# Patient Record
Sex: Female | Born: 1950 | Race: Black or African American | Hispanic: No | Marital: Married | Smoking: Former smoker
Health system: Southern US, Community
[De-identification: ages and names within clinical notes are randomized; demographics above are authoritative.]

## PROBLEM LIST (undated history)

## (undated) DIAGNOSIS — N200 Calculus of kidney: Secondary | ICD-10-CM

## (undated) DIAGNOSIS — IMO0002 Reserved for concepts with insufficient information to code with codable children: Secondary | ICD-10-CM

## (undated) DIAGNOSIS — T7840XA Allergy, unspecified, initial encounter: Secondary | ICD-10-CM

## (undated) DIAGNOSIS — G510 Bell's palsy: Secondary | ICD-10-CM

## (undated) DIAGNOSIS — I10 Essential (primary) hypertension: Secondary | ICD-10-CM

## (undated) DIAGNOSIS — D649 Anemia, unspecified: Secondary | ICD-10-CM

## (undated) DIAGNOSIS — E669 Obesity, unspecified: Secondary | ICD-10-CM

## (undated) DIAGNOSIS — M329 Systemic lupus erythematosus, unspecified: Secondary | ICD-10-CM

## (undated) DIAGNOSIS — K219 Gastro-esophageal reflux disease without esophagitis: Secondary | ICD-10-CM

## (undated) DIAGNOSIS — M797 Fibromyalgia: Secondary | ICD-10-CM

## (undated) DIAGNOSIS — K635 Polyp of colon: Secondary | ICD-10-CM

## (undated) DIAGNOSIS — E079 Disorder of thyroid, unspecified: Secondary | ICD-10-CM

## (undated) HISTORY — PX: BREAST EXCISIONAL BIOPSY: SUR124

## (undated) HISTORY — DX: Disorder of thyroid, unspecified: E07.9

## (undated) HISTORY — DX: Obesity, unspecified: E66.9

## (undated) HISTORY — DX: Gastro-esophageal reflux disease without esophagitis: K21.9

## (undated) HISTORY — PX: BREAST LUMPECTOMY: SHX2

## (undated) HISTORY — PX: BREAST BIOPSY: SHX20

## (undated) HISTORY — DX: Essential (primary) hypertension: I10

## (undated) HISTORY — PX: AUGMENTATION MAMMAPLASTY: SUR837

## (undated) HISTORY — DX: Calculus of kidney: N20.0

## (undated) HISTORY — DX: Anemia, unspecified: D64.9

## (undated) HISTORY — DX: Systemic lupus erythematosus, unspecified: M32.9

## (undated) HISTORY — DX: Polyp of colon: K63.5

## (undated) HISTORY — DX: Reserved for concepts with insufficient information to code with codable children: IMO0002

## (undated) HISTORY — PX: POLYPECTOMY: SHX149

## (undated) HISTORY — DX: Allergy, unspecified, initial encounter: T78.40XA

---

## 1997-12-25 ENCOUNTER — Other Ambulatory Visit: Admission: RE | Admit: 1997-12-25 | Discharge: 1997-12-25 | Payer: Self-pay | Admitting: Nephrology

## 1998-03-03 ENCOUNTER — Other Ambulatory Visit: Admission: RE | Admit: 1998-03-03 | Discharge: 1998-03-03 | Payer: Self-pay | Admitting: Nephrology

## 1998-03-07 ENCOUNTER — Ambulatory Visit (HOSPITAL_COMMUNITY): Admission: RE | Admit: 1998-03-07 | Discharge: 1998-03-07 | Payer: Self-pay | Admitting: Nephrology

## 1999-05-25 ENCOUNTER — Other Ambulatory Visit: Admission: RE | Admit: 1999-05-25 | Discharge: 1999-05-25 | Payer: Self-pay | Admitting: Nephrology

## 1999-12-28 ENCOUNTER — Ambulatory Visit (HOSPITAL_COMMUNITY): Admission: RE | Admit: 1999-12-28 | Discharge: 1999-12-28 | Payer: Self-pay | Admitting: Nephrology

## 1999-12-28 ENCOUNTER — Encounter: Payer: Self-pay | Admitting: Nephrology

## 2001-05-22 ENCOUNTER — Other Ambulatory Visit: Admission: RE | Admit: 2001-05-22 | Discharge: 2001-05-22 | Payer: Self-pay | Admitting: Nephrology

## 2003-11-11 ENCOUNTER — Other Ambulatory Visit: Admission: RE | Admit: 2003-11-11 | Discharge: 2003-11-11 | Payer: Self-pay | Admitting: Nephrology

## 2004-06-03 ENCOUNTER — Emergency Department (HOSPITAL_COMMUNITY): Admission: EM | Admit: 2004-06-03 | Discharge: 2004-06-03 | Payer: Self-pay | Admitting: Emergency Medicine

## 2004-06-06 ENCOUNTER — Emergency Department (HOSPITAL_COMMUNITY): Admission: EM | Admit: 2004-06-06 | Discharge: 2004-06-06 | Payer: Self-pay | Admitting: Emergency Medicine

## 2004-11-30 ENCOUNTER — Encounter: Admission: RE | Admit: 2004-11-30 | Discharge: 2004-11-30 | Payer: Self-pay | Admitting: Nephrology

## 2005-01-11 ENCOUNTER — Other Ambulatory Visit: Admission: RE | Admit: 2005-01-11 | Discharge: 2005-01-11 | Payer: Self-pay | Admitting: Nephrology

## 2006-04-04 ENCOUNTER — Other Ambulatory Visit: Admission: RE | Admit: 2006-04-04 | Discharge: 2006-04-04 | Payer: Self-pay | Admitting: Nephrology

## 2006-10-06 ENCOUNTER — Emergency Department (HOSPITAL_COMMUNITY): Admission: EM | Admit: 2006-10-06 | Discharge: 2006-10-06 | Payer: Self-pay | Admitting: Emergency Medicine

## 2011-12-29 ENCOUNTER — Ambulatory Visit
Admission: RE | Admit: 2011-12-29 | Discharge: 2011-12-29 | Disposition: A | Discharge status: Home / Self Care | Payer: 59 | Source: Ambulatory Visit | Attending: Nephrology | Admitting: Nephrology

## 2011-12-29 ENCOUNTER — Other Ambulatory Visit: Payer: Self-pay | Admitting: Nephrology

## 2011-12-29 DIAGNOSIS — Z87891 Personal history of nicotine dependence: Secondary | ICD-10-CM

## 2011-12-30 ENCOUNTER — Other Ambulatory Visit: Payer: Self-pay | Admitting: Nephrology

## 2011-12-30 DIAGNOSIS — Z1231 Encounter for screening mammogram for malignant neoplasm of breast: Secondary | ICD-10-CM

## 2012-01-17 ENCOUNTER — Ambulatory Visit: Payer: 59

## 2012-01-17 ENCOUNTER — Ambulatory Visit
Admission: RE | Admit: 2012-01-17 | Discharge: 2012-01-17 | Disposition: A | Discharge status: Home / Self Care | Payer: 59 | Source: Ambulatory Visit | Attending: Nephrology | Admitting: Nephrology

## 2012-01-17 DIAGNOSIS — Z1231 Encounter for screening mammogram for malignant neoplasm of breast: Secondary | ICD-10-CM

## 2013-01-04 ENCOUNTER — Emergency Department (INDEPENDENT_AMBULATORY_CARE_PROVIDER_SITE_OTHER): Payer: 59

## 2013-01-04 ENCOUNTER — Emergency Department (HOSPITAL_COMMUNITY)
Admission: EM | Admit: 2013-01-04 | Discharge: 2013-01-04 | Disposition: A | Discharge status: Home / Self Care | Payer: 59 | Source: Home / Self Care | Attending: Family Medicine | Admitting: Family Medicine

## 2013-01-04 ENCOUNTER — Encounter (HOSPITAL_COMMUNITY): Payer: Self-pay | Admitting: *Deleted

## 2013-01-04 DIAGNOSIS — IMO0002 Reserved for concepts with insufficient information to code with codable children: Secondary | ICD-10-CM

## 2013-01-04 DIAGNOSIS — S46812A Strain of other muscles, fascia and tendons at shoulder and upper arm level, left arm, initial encounter: Secondary | ICD-10-CM

## 2013-01-04 DIAGNOSIS — S46819A Strain of other muscles, fascia and tendons at shoulder and upper arm level, unspecified arm, initial encounter: Secondary | ICD-10-CM

## 2013-01-04 DIAGNOSIS — S43499A Other sprain of unspecified shoulder joint, initial encounter: Secondary | ICD-10-CM

## 2013-01-04 MED ORDER — IBUPROFEN 600 MG PO TABS: 600.0000 mg | ORAL_TABLET | Freq: Three times a day (TID) | ORAL | Status: DC

## 2013-01-04 MED ORDER — CARISOPRODOL 350 MG PO TABS: 350.0000 mg | ORAL_TABLET | Freq: Three times a day (TID) | ORAL | Status: DC

## 2013-01-04 MED ORDER — TRAMADOL HCL 50 MG PO TABS: 50.0000 mg | ORAL_TABLET | Freq: Three times a day (TID) | ORAL | Status: DC | PRN

## 2013-01-04 NOTE — ED Notes (Deleted)
Pt       Reports  Symptoms  Of  sorethroat  With  Swollen  Gland  l  Side  Of  Neck       X     1   Week     He  Is  A  Smoker

## 2013-01-04 NOTE — ED Provider Notes (Signed)
History     CSN: [id number]  Arrival date & time 01/04/13  1122   First MD Initiated Contact with Patient 01/04/13 1244      Chief Complaint  Patient presents with  . Shoulder Pain    (Consider location/radiation/quality/duration/timing/severity/associated sxs/prior treatment) HPI Comments: 62 year old female with history of lupus. Here complaining of left shoulder pain exacerbation for 2 days. Patient reports she's had similar symptoms intermittently for several months in the past. 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 Denies pain radiation.  Denies numbness, weakness or paresthesias in her upper extremities. Denies chest pain or shortness of breath. Patient states she had a lupus flareup last month and to the cycle of steroids.    History reviewed. No pertinent past medical history.  History reviewed. No pertinent past surgical history.  No family history on file.  History  Substance Use Topics  . Smoking status: Not on file  . Smokeless tobacco: Not on file  . Alcohol Use: Yes    OB History   Grav Para Term Preterm Abortions TAB SAB Ect Mult Living                  Review of Systems  Constitutional: Negative for fever and chills.  HENT: Negative for sore throat.   Respiratory: Negative for cough and shortness of breath.   Cardiovascular: Negative for chest pain.  Skin: Negative for rash.  Neurological: Negative for weakness and numbness.  All other systems reviewed and are negative.    Allergies  Review of patient's allergies indicates no known allergies.  Home Medications   Current Outpatient Rx  Name  Route  Sig  Dispense  Refill  . carisoprodol (SOMA) 350 MG tablet   Oral   Take 1 tablet (350 mg total) by mouth 3 (three) times daily.   15 tablet   0   . ibuprofen (ADVIL,MOTRIN) 600 MG tablet   Oral   Take 1 tablet (600 mg total) by mouth 3 (three) times daily.   30 tablet   0   .  traMADol (ULTRAM) 50 MG tablet   Oral   Take 1 tablet (50 mg total) by mouth every 8 (eight) hours as needed for pain.   20 tablet   0     BP 136/80  Pulse 75  Temp(Src) 98.1 F (36.7 C) (Oral)  Resp 16  SpO2 98%  Physical Exam  Nursing note and vitals reviewed. Constitutional: She is oriented to person, place, and time. She appears well-developed and well-nourished. No distress.  HENT:  Head: Normocephalic and atraumatic.  Mouth/Throat: Oropharynx is clear and moist. No oropharyngeal exudate.  Eyes: Conjunctivae and EOM are normal. Pupils are equal, round, and reactive to light. No scleral icterus.  Neck: No JVD present. No thyromegaly present.  Cardiovascular: Normal rate, regular rhythm and normal heart sounds.   Pulmonary/Chest: Effort normal and breath sounds normal. No respiratory distress. She has no wheezes. She has no rales. She exhibits no tenderness.  Musculoskeletal:  Left shoulder: No erythema, swelling, bruising or increased temperature. No tenderness over shoulder joint. There is muscle contraction, swelling and tenderness diffusely over left trapezius muscle. Limited proximal range of motion due to pain but no signs of dislocation. Patient able to abduct left arm at shoulder joint past 90 degrees with mild pain. Negative empty can test. Reported no discomfort with arm adduction past mid line and with posterior extension with extended arm.  Entire  left upper extremity appears neurovascularly intact.  Lymphadenopathy:    She has no cervical adenopathy.  Neurological: She is alert and oriented to person, place, and time.  Skin: No rash noted. She is not diaphoretic.    ED Course  Procedures (including critical care time)  Labs Reviewed - No data to display Dg Cervical Spine Complete  01/04/2013  *RADIOLOGY REPORT*  Clinical Data: Pain and swelling left shoulder for 2 days.  CERVICAL SPINE - COMPLETE 4+ VIEW  Comparison: None.  Findings: The alignment of the cervical  spine is anatomic.  There is disc space narrowing C4-5, C5-6, and C6-7.  There is no significant neural foraminal narrowing.  AP and odontoid views are unremarkable.  IMPRESSION: Mild cervical spondylosis as described.   Original Report Authenticated By: Davonna Belling, M.D.    Dg Shoulder 1v Left  01/04/2013  *RADIOLOGY REPORT*  Clinical Data: Pain, swelling  LEFT SHOULDER - 1 VIEW  Comparison: None.  Findings: Single view of the left shoulder submitted.  No acute fracture or subluxation.  IMPRESSION: Limited study .  No acute fracture or subluxation.   Original Report Authenticated By: Natasha Mead, M.D.      1. Trapezius muscle strain, left, initial encounter       MDM  Treated with carisoprodol, ibuprofen and tramadol.  Supportive care and red flags that should prompt her return to medical attention discussed with patient and provided in writing.         Sharin Grave, MD 01/04/13 1429

## 2013-01-04 NOTE — ED Notes (Signed)
Pt  Reports  Pain l  Shoulder      X  2  Days      denys  Any  specefic  Injury  But    Works  In post office      And     Uses  arns  A  Lot         She  Reports   pain worse  On movement

## 2013-02-28 ENCOUNTER — Ambulatory Visit: Payer: 59 | Attending: Family Medicine | Admitting: Internal Medicine

## 2013-02-28 VITALS — BP 159/98 | HR 76 | Temp 98.0°F | Resp 16 | Ht 63.0 in | Wt 193.4 lb

## 2013-02-28 DIAGNOSIS — F411 Generalized anxiety disorder: Secondary | ICD-10-CM | POA: Insufficient documentation

## 2013-02-28 DIAGNOSIS — M329 Systemic lupus erythematosus, unspecified: Secondary | ICD-10-CM | POA: Insufficient documentation

## 2013-02-28 DIAGNOSIS — Z0000 Encounter for general adult medical examination without abnormal findings: Secondary | ICD-10-CM | POA: Insufficient documentation

## 2013-02-28 DIAGNOSIS — E785 Hyperlipidemia, unspecified: Secondary | ICD-10-CM | POA: Insufficient documentation

## 2013-02-28 DIAGNOSIS — Z79899 Other long term (current) drug therapy: Secondary | ICD-10-CM | POA: Insufficient documentation

## 2013-02-28 DIAGNOSIS — I10 Essential (primary) hypertension: Secondary | ICD-10-CM | POA: Insufficient documentation

## 2013-02-28 MED ORDER — DIAZEPAM 2 MG PO TABS: 2.0000 mg | ORAL_TABLET | Freq: Two times a day (BID) | ORAL | Status: DC | PRN

## 2013-02-28 MED ORDER — CYCLOBENZAPRINE HCL 10 MG PO TABS: 10.0000 mg | ORAL_TABLET | Freq: Two times a day (BID) | ORAL | Status: DC | PRN

## 2013-02-28 MED ORDER — VERAPAMIL HCL ER 300 MG PO CP24: 300.0000 mg | ORAL_CAPSULE | Freq: Every day | ORAL | Status: DC

## 2013-02-28 NOTE — Progress Notes (Signed)
Pt will call in to schedule 3 mnth checkup

## 2013-02-28 NOTE — Progress Notes (Signed)
Patient ID: [name], female   DOB: Oct 30, 1950, 62 y.o.   MRN: [id number] Patient Demographics  Ann Rodgers, is a 62 y.o. female  [id number]  [id number]  DOB - 1951/06/15  Chief Complaint  Patient presents with  . Establish Care        Subjective:   Ann Rodgers today is here to establish primary care. Patient has No headache, No chest pain, No abdominal pain - No Nausea, No new weakness tingling or numbness, No Cough - SOB.  Patient reported that followed Dr. Earley Abide who has retired and she is here to establish care. She has been doing well.  She has not had any recent lupus flare she works in a post office  Patient was that she has been anxious due to reoccurrence of prostate cancer in her husband.     Objective:    Filed Vitals:   02/28/13 1539  BP: 159/98  Pulse: 76  Temp: 98 F (36.7 C)  Resp: 16  Height: 5\' 3"  (1.6 m)  Weight: 193 lb 6.4 oz (87.726 kg)  SpO2: 99%     ALLERGIES:   Allergies  Allergen Reactions  . Codeine   . Sulfa Antibiotics     PAST MEDICAL HISTORY: Hypertension Hyperlipidemia- patient reports that she used to be on medications but had been stopped Lupus   PAST SURGICAL HISTORY: No past surgical history on file.  FAMILY HISTORY: History reviewed. No pertinent family history.  MEDICATIONS AT HOME: Prior to Admission medications   Medication Sig Start Date End Date Taking? Authorizing Provider  cyclobenzaprine (FLEXERIL) 10 MG tablet Take 1 tablet (10 mg total) by mouth 2 (two) times daily as needed for muscle spasms. 02/28/13  Yes Loura Pitt Jenna Luo, MD  Verapamil HCl CR 300 MG CP24 Take 1 capsule (300 mg total) by mouth daily. 02/28/13  Yes Jovonta Levit Jenna Luo, MD  diazepam (VALIUM) 2 MG tablet Take 1 tablet (2 mg total) by mouth every 12 (twelve) hours as needed for anxiety. 02/28/13   Marsh Heckler Jenna Luo, MD  ibuprofen (ADVIL,MOTRIN) 600 MG tablet Take 1 tablet (600 mg total) by mouth 3 (three) times daily. 01/04/13    Adlih Moreno-Coll, MD    REVIEW OF SYSTEMS:  Constitutional:   No   Fevers, chills, fatigue.  HEENT:    No headaches, Sore throat,   Cardio-vascular: No chest pain,  Orthopnea, swelling in lower extremities, anasarca, palpitations  GI:  No abdominal pain, nausea, vomiting, diarrhea  Resp: No shortness of breath,  No coughing up of blood.No cough.No wheezing.  Skin:  no rash or lesions.  GU:  no dysuria, change in color of urine, no urgency or frequency.  No flank pain.  Musculoskeletal: No joint pain or swelling.  No decreased range of motion.  No back pain.  Psych: No change in mood or affect. No depression or anxiety.  No memory loss.   Exam  General appearance :Awake, alert, NAD, Speech Clear. HEENT: Atraumatic and Normocephalic, PERLA Neck: supple, no JVD. No cervical lymphadenopathy.  Chest: clear to auscultation bilaterally, no wheezing, rales or rhonchi CVS: S1 S2 regular, no murmurs.  Abdomen: soft, NBS, NT, ND, no gaurding, rigidity or rebound. Extremities: No cyanosis, clubbing, B/L Lower Ext shows no edema,  Neurology: Awake alert, and oriented X 3, CN II-XII intact, Non focal Skin:No Rash or lesions Wounds: N/A    Data Review   Basic Metabolic Panel: No results found for this basename: NA, K, CL, CO2, GLUCOSE, BUN, CREATININE, CALCIUM, MG,  PHOS,  in the last 168 hours Liver Function Tests: No results found for this basename: AST, ALT, ALKPHOS, BILITOT, PROT, ALBUMIN,  in the last 168 hours  CBC: No results found for this basename: WBC, NEUTROABS, HGB, HCT, MCV, PLT,  in the last 168 hours ------------------------------------------------------------------------------------------------------------------ No results found for this basename: HGBA1C,  in the last 72 hours ------------------------------------------------------------------------------------------------------------------ No results found for this basename: CHOL, HDL, LDLCALC, TRIG,  CHOLHDL, LDLDIRECT,  in the last 72 hours ------------------------------------------------------------------------------------------------------------------ No results found for this basename: TSH, T4TOTAL, FREET3, T3FREE, THYROIDAB,  in the last 72 hours ------------------------------------------------------------------------------------------------------------------ No results found for this basename: VITAMINB12, FOLATE, FERRITIN, TIBC, IRON, RETICCTPCT,  in the last 72 hours  Coagulation profile  No results found for this basename: INR, PROTIME,  in the last 168 hours    Assessment & Plan   Active Problems: 1.hypertension -Will refill her verapamil    2.hyperlipidemia - Check lipid panel at the next visit   3.anxiety state: - Patient reports significant anxiety due to reoccurrence of the prostrate cancer in her husband after 8 years. Place on Valium 2 mg q12hrs PRN (#10 tabs, no refills)     4. LUPUS - Continue with Flexeril and ibuprofen as needed, no recent lupus flares   Recommendations: check CBC, BMET, lipid panel before next visit   Follow-up in 3 months   Ardie Mclennan M.D. 02/28/2013, 4:34 PM

## 2013-02-28 NOTE — Progress Notes (Signed)
Patient here to establish care  

## 2013-12-11 ENCOUNTER — Other Ambulatory Visit: Payer: Self-pay | Admitting: Internal Medicine

## 2013-12-11 DIAGNOSIS — Z1231 Encounter for screening mammogram for malignant neoplasm of breast: Secondary | ICD-10-CM

## 2013-12-24 ENCOUNTER — Ambulatory Visit
Admission: RE | Admit: 2013-12-24 | Discharge: 2013-12-24 | Disposition: A | Discharge status: Home / Self Care | Payer: Self-pay | Source: Ambulatory Visit | Attending: Internal Medicine | Admitting: Internal Medicine

## 2013-12-24 DIAGNOSIS — Z1231 Encounter for screening mammogram for malignant neoplasm of breast: Secondary | ICD-10-CM

## 2013-12-27 ENCOUNTER — Other Ambulatory Visit: Payer: Self-pay | Admitting: Internal Medicine

## 2013-12-27 DIAGNOSIS — R928 Other abnormal and inconclusive findings on diagnostic imaging of breast: Secondary | ICD-10-CM

## 2014-01-14 ENCOUNTER — Encounter (INDEPENDENT_AMBULATORY_CARE_PROVIDER_SITE_OTHER): Payer: Self-pay

## 2014-01-14 ENCOUNTER — Other Ambulatory Visit: Payer: Self-pay | Admitting: Internal Medicine

## 2014-01-14 ENCOUNTER — Ambulatory Visit
Admission: RE | Admit: 2014-01-14 | Discharge: 2014-01-14 | Disposition: A | Discharge status: Home / Self Care | Payer: 59 | Source: Ambulatory Visit | Attending: Internal Medicine | Admitting: Internal Medicine

## 2014-01-14 DIAGNOSIS — R928 Other abnormal and inconclusive findings on diagnostic imaging of breast: Secondary | ICD-10-CM

## 2014-01-18 ENCOUNTER — Other Ambulatory Visit: Payer: Self-pay | Admitting: Internal Medicine

## 2014-01-18 ENCOUNTER — Ambulatory Visit
Admission: RE | Admit: 2014-01-18 | Discharge: 2014-01-18 | Disposition: A | Discharge status: Home / Self Care | Payer: 59 | Source: Ambulatory Visit | Attending: Internal Medicine | Admitting: Internal Medicine

## 2014-01-18 DIAGNOSIS — R928 Other abnormal and inconclusive findings on diagnostic imaging of breast: Secondary | ICD-10-CM

## 2014-03-15 ENCOUNTER — Other Ambulatory Visit: Payer: Self-pay | Admitting: Internal Medicine

## 2014-03-15 DIAGNOSIS — R110 Nausea: Secondary | ICD-10-CM

## 2014-03-15 DIAGNOSIS — R1011 Right upper quadrant pain: Secondary | ICD-10-CM

## 2014-03-18 ENCOUNTER — Ambulatory Visit
Admission: RE | Admit: 2014-03-18 | Discharge: 2014-03-18 | Disposition: A | Discharge status: Home / Self Care | Payer: 59 | Source: Ambulatory Visit | Attending: Internal Medicine | Admitting: Internal Medicine

## 2014-03-18 DIAGNOSIS — R1011 Right upper quadrant pain: Secondary | ICD-10-CM

## 2014-03-18 DIAGNOSIS — R110 Nausea: Secondary | ICD-10-CM

## 2014-03-19 ENCOUNTER — Other Ambulatory Visit (HOSPITAL_COMMUNITY): Payer: Self-pay | Admitting: Internal Medicine

## 2014-03-19 DIAGNOSIS — R1011 Right upper quadrant pain: Secondary | ICD-10-CM

## 2014-03-27 ENCOUNTER — Encounter (HOSPITAL_COMMUNITY)
Admission: RE | Admit: 2014-03-27 | Discharge: 2014-03-27 | Disposition: A | Discharge status: Home / Self Care | Payer: 59 | Source: Ambulatory Visit | Attending: Internal Medicine | Admitting: Internal Medicine

## 2014-03-27 DIAGNOSIS — R1011 Right upper quadrant pain: Secondary | ICD-10-CM | POA: Insufficient documentation

## 2014-03-27 MED ORDER — SINCALIDE 5 MCG IJ SOLR
0.0200 ug/kg | Freq: Once | INTRAMUSCULAR | Status: AC
  Administered 2014-03-27: 4.5 ug via INTRAVENOUS

## 2014-03-27 MED ORDER — SINCALIDE 5 MCG IJ SOLR
INTRAMUSCULAR | Status: AC
  Administered 2014-03-27: 4.5 ug via INTRAVENOUS
  Filled 2014-03-27: qty 5

## 2014-03-27 MED ORDER — TECHNETIUM TC 99M MEBROFENIN IV KIT
5.0000 | PACK | Freq: Once | INTRAVENOUS | Status: AC | PRN
  Administered 2014-03-27: 5 via INTRAVENOUS

## 2014-03-27 MED ORDER — STERILE WATER FOR INJECTION IJ SOLN
INTRAMUSCULAR | Status: AC
  Administered 2014-03-27: 5 mL via INTRAMUSCULAR
  Filled 2014-03-27: qty 10

## 2014-03-27 MED ORDER — STERILE WATER FOR INJECTION IJ SOLN
5.0000 mL | Freq: Once | INTRAMUSCULAR | Status: AC
  Administered 2014-03-27: 5 mL via INTRAMUSCULAR
  Filled 2014-03-27: qty 5

## 2014-09-06 HISTORY — PX: COLONOSCOPY: SHX174

## 2015-02-10 ENCOUNTER — Other Ambulatory Visit: Payer: Self-pay

## 2015-02-10 DIAGNOSIS — Z1231 Encounter for screening mammogram for malignant neoplasm of breast: Secondary | ICD-10-CM

## 2015-02-17 ENCOUNTER — Ambulatory Visit
Admission: RE | Admit: 2015-02-17 | Discharge: 2015-02-17 | Disposition: A | Discharge status: Home / Self Care | Payer: 59 | Source: Ambulatory Visit

## 2015-02-17 ENCOUNTER — Encounter (INDEPENDENT_AMBULATORY_CARE_PROVIDER_SITE_OTHER): Payer: Self-pay

## 2015-02-17 DIAGNOSIS — Z1231 Encounter for screening mammogram for malignant neoplasm of breast: Secondary | ICD-10-CM

## 2015-07-29 ENCOUNTER — Ambulatory Visit (AMBULATORY_SURGERY_CENTER): Payer: Self-pay

## 2015-07-29 VITALS — Ht 62.5 in | Wt 208.0 lb

## 2015-07-29 DIAGNOSIS — Z1211 Encounter for screening for malignant neoplasm of colon: Secondary | ICD-10-CM

## 2015-07-29 MED ORDER — SUPREP BOWEL PREP KIT 17.5-3.13-1.6 GM/177ML PO SOLN: 1.0000 | Freq: Once | ORAL | Status: DC

## 2015-07-29 NOTE — Progress Notes (Signed)
No allergies to eggs or soy No past problems with anesthesia No home oxygen No diet/weight loss meds  Has email and internet; registered emmi 

## 2015-08-15 ENCOUNTER — Encounter: Payer: Self-pay | Admitting: Internal Medicine

## 2015-08-15 ENCOUNTER — Ambulatory Visit (AMBULATORY_SURGERY_CENTER): Payer: 59 | Admitting: Internal Medicine

## 2015-08-15 VITALS — BP 150/97 | HR 76 | Temp 97.9°F | Resp 18 | Ht 62.5 in | Wt 208.0 lb

## 2015-08-15 DIAGNOSIS — D124 Benign neoplasm of descending colon: Secondary | ICD-10-CM | POA: Diagnosis not present

## 2015-08-15 DIAGNOSIS — D122 Benign neoplasm of ascending colon: Secondary | ICD-10-CM | POA: Diagnosis not present

## 2015-08-15 DIAGNOSIS — D120 Benign neoplasm of cecum: Secondary | ICD-10-CM

## 2015-08-15 DIAGNOSIS — Z1211 Encounter for screening for malignant neoplasm of colon: Secondary | ICD-10-CM | POA: Diagnosis not present

## 2015-08-15 MED ORDER — SODIUM CHLORIDE 0.9 % IV SOLN: 500.0000 mL | INTRAVENOUS | Status: DC

## 2015-08-15 NOTE — Op Note (Signed)
[place]  [address]. [place], [address]   COLONOSCOPY PROCEDURE REPORT  PATIENT: Ann, Rodgers  MR#: [id number] BIRTHDATE: 02/16/1951 , 64  yrs. old GENDER: female ENDOSCOPIST: Eustace Quail, MD REFERRED [id number] Ardeth Perfect, M.D. PROCEDURE DATE:  08/15/2015 PROCEDURE:   Colonoscopy, screening and Colonoscopy with snare polypectomy x 5 First Screening Colonoscopy - Avg.  risk and is 50 yrs.  old or older Yes.  Prior Negative Screening - Now for repeat screening. N/A  History of Adenoma - Now for follow-up colonoscopy & has been > or = to 3 yrs.  N/A  Polyps removed today? Yes ASA CLASS:   Class II INDICATIONS:Screening for colonic neoplasia and Colorectal Neoplasm Risk Assessment for this procedure is average risk. MEDICATIONS: Monitored anesthesia care and Propofol 300 mg IV  DESCRIPTION OF PROCEDURE:   After the risks benefits and alternatives of the procedure were thoroughly explained, informed consent was obtained.  The digital rectal exam revealed no abnormalities of the rectum.   The LB [id number]  endoscope was introduced through the anus and advanced to the cecum, which was identified by both the appendix and ileocecal valve. No adverse events experienced.   The quality of the prep was excellent. (Suprep was used)  The instrument was then slowly withdrawn as the colon was fully examined. Estimated blood loss is zero unless otherwise noted in this procedure report.   COLON FINDINGS: Five polyps ranging between 3-79mm in size were found at the cecum, in the ascending colon, and descending colon.  A polypectomy was performed with a cold snare.  The resection was complete, the polyp tissue was completely retrieved and sent to histology.   The examination was otherwise normal.  Retroflexed views revealed internal hemorrhoids. The time to cecum = 1.9 Withdrawal time = 12.5   The scope was withdrawn and the procedure completed. COMPLICATIONS:  There were no immediate complications.  ENDOSCOPIC IMPRESSION: 1.   Five polyps were found in the colon; polypectomy was performed with a cold snare 2.   The examination was otherwise normal  RECOMMENDATIONS: 1. Repeat Colonoscopy in 3 years.  eSigned:  Eustace Quail, MD 08/15/2015 11:01 AM   cc: The Patient   ; Velna Hatchet, MD

## 2015-08-15 NOTE — Progress Notes (Signed)
Report to PACU, RN, vss, BBS= Clear.  

## 2015-08-15 NOTE — Patient Instructions (Signed)

## 2015-08-15 NOTE — Progress Notes (Signed)
Called to room to assist during endoscopic procedure.  Patient ID and intended procedure confirmed with present staff. Received instructions for my participation in the procedure from the performing physician.  

## 2015-08-18 ENCOUNTER — Telehealth: Payer: Self-pay

## 2015-08-18 NOTE — Telephone Encounter (Signed)
  Follow up Call-  Call back number 08/15/2015  Post procedure Call Back phone  # [phone]  Permission to leave phone message Yes     Patient questions:  Do you have a fever, pain , or abdominal swelling? No. Pain Score  0 *  Have you tolerated food without any problems? Yes.    Have you been able to return to your normal activities? Yes.    Do you have any questions about your discharge instructions: Diet   No. Medications  No. Follow up visit  No.  Do you have questions or concerns about your Care? No.  Actions: * If pain score is 4 or above: No action needed, pain <4.

## 2015-08-19 ENCOUNTER — Encounter: Payer: Self-pay | Admitting: Internal Medicine

## 2015-10-29 ENCOUNTER — Ambulatory Visit (INDEPENDENT_AMBULATORY_CARE_PROVIDER_SITE_OTHER): Payer: 59

## 2015-10-29 ENCOUNTER — Ambulatory Visit (INDEPENDENT_AMBULATORY_CARE_PROVIDER_SITE_OTHER): Payer: 59 | Admitting: Physician Assistant

## 2015-10-29 VITALS — BP 153/87 | HR 79 | Temp 98.1°F | Resp 16 | Ht 63.0 in | Wt 210.0 lb

## 2015-10-29 DIAGNOSIS — M79672 Pain in left foot: Secondary | ICD-10-CM

## 2015-10-29 DIAGNOSIS — S92912A Unspecified fracture of left toe(s), initial encounter for closed fracture: Secondary | ICD-10-CM

## 2015-10-29 NOTE — Progress Notes (Signed)
[facility] [address], [place] [address] [phone]  Date:  10/29/2015   Name:  Ann Rodgers   DOB:  1951-05-25   MRN:  [id number]  PCP:  Velna Hatchet, MD    Chief Complaint: Foot Pain   History of Present Illness:  This is a 65 y.o. female with PMH HTN, anxiety, lupus, HLD who is presenting with left foot pain x 2 weeks after falling down stairs. 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 She did not get seen initially. She states the pain is not getting any better. Still having swelling on top of foot. She is able to ambulate but is limping some. She denies paresthesias. She has been taking ibuprofen prn and helps. No foot injury before.  Review of Systems:  Review of Systems See HPI  Patient Active Problem List   Diagnosis Date Noted  . Essential hypertension, benign 02/28/2013  . Other and unspecified hyperlipidemia 02/28/2013  . Lupus ([place]) 02/28/2013  . Anxiety state, unspecified 02/28/2013    Prior to Admission medications   Medication Sig Start Date End Date Taking? Authorizing Provider  aspirin EC 81 MG tablet Take 81 mg by mouth daily.   Yes Historical Provider, MD  cyclobenzaprine (FLEXERIL) 10 MG tablet Take 1 tablet (10 mg total) by mouth 2 (two) times daily as needed for muscle spasms. 02/28/13  Yes Ripudeep Krystal Eaton, MD  gabapentin (NEURONTIN) 100 MG capsule  08/11/15  Yes Historical Provider, MD  ibuprofen (ADVIL,MOTRIN) 600 MG tablet Take 1 tablet (600 mg total) by mouth 3 (three) times daily. 01/04/13  Yes Adlih Moreno-Coll, MD  losartan-hydrochlorothiazide (HYZAAR) 50-12.5 MG tablet  07/28/15  Yes Historical Provider, MD  SYNTHROID 75 MCG tablet  07/28/15  Yes Historical Provider, MD  Verapamil HCl CR 300 MG CP24 Take 1 capsule (300 mg total) by mouth daily. 02/28/13  Yes Ripudeep Krystal Eaton, MD    Allergies  Allergen Reactions  . Codeine     "hot fever" and abdominal pain  . Sulfa Antibiotics     Past Surgical History  Procedure  Laterality Date  . Breast lumpectomy      x2    Social History  Substance Use Topics  . Smoking status: Former Smoker    Quit date: 08/06/2014  . Smokeless tobacco: Never Used  . Alcohol Use: No    Family History  Problem Relation Age of Onset  . Colon cancer Neg Hx     Medication list has been reviewed and updated.  Physical Examination:  Physical Exam  Constitutional: She is oriented to person, place, and time. She appears well-developed and well-nourished. No distress.  HENT:  Head: Normocephalic and atraumatic.  Right Ear: Hearing normal.  Left Ear: Hearing normal.  Nose: Nose normal.  Eyes: Conjunctivae and lids are normal. Right eye exhibits no discharge. Left eye exhibits no discharge. No scleral icterus.  Cardiovascular: Normal rate, regular rhythm and normal pulses.   Pulmonary/Chest: Effort normal. No respiratory distress.  Musculoskeletal:       Right ankle: Normal.       Left ankle: Normal.       Right foot: Normal.       Left foot: There is decreased range of motion (decreased toe flexion), tenderness and bony tenderness.       Feet:  Most tenderness at dorsal foot and middle digit. Erythema and swelling present. No warmth. Pedal pulses intact.  Neurological: She is alert and oriented to person, place,  and time.  Skin: Skin is warm, dry and intact. No lesion and no rash noted.  Psychiatric: She has a normal mood and affect. Her speech is normal and behavior is normal. Thought content normal.    BP 153/87 mmHg  Pulse 79  Temp(Src) 98.1 F (36.7 C) (Oral)  Resp 16  Ht 5\' 3"  (1.6 m)  Wt 210 lb (95.255 kg)  BMI 37.21 kg/m2  SpO2 98%  Dg Foot Complete Left  10/29/2015  CLINICAL DATA:  Left foot pain for 2 weeks with no improvement, dorsally located at the base of the third digit. Initial encounter. EXAM: LEFT FOOT - COMPLETE 3+ VIEW COMPARISON:  None. FINDINGS: Branching fracture in the third proximal phalanx from the shaft to the base. There is  extension to the MTP joint without offset. Associated periosteal reaction. There is also periosteal reaction at the second and fourth proximal phalanges without visible fracture line. No dislocation. First MTP narrowing and spurring, mild. Heel spur. IMPRESSION: Healing nondisplaced third proximal phalanx fracture. Second and fourth proximal phalanx periosteal reaction suggests additional, now non visible fractures. Electronically Signed   By: Monte Fantasia M.D.   On: 10/29/2015 14:38    Assessment and Plan:  1. Fracture, toe, left, closed, initial encounter 2. Left foot pain 3rd digit fracture. Placed in post-op shoe. Apply ice and take ibuprofen prn. Referred to ortho. - Ambulatory referral to Orthopedic Surgery - DG Foot Complete Left; Future   Benjaman Pott. Drenda Freeze, MHS [facility]  10/29/2015

## 2015-10-29 NOTE — Patient Instructions (Addendum)
Because you received an x-ray today, you will receive an invoice from [facility]. Please contact [facility] at [phone] with questions or concerns regarding your invoice. Our billing staff will not be able to assist you with those questions.  Ibuprofen as needed. Wear post-op shoe. Ice your foot a couple times a day. You will get a phone call to make appointment with ortho

## 2015-11-03 ENCOUNTER — Telehealth: Payer: Self-pay | Admitting: Physician Assistant

## 2015-11-03 NOTE — Telephone Encounter (Signed)
Pt needing note for work. Needs to say "incapacitated". She works for [employer]. Needs out 2/23 - 3/1 -- has ortho appt on 3/1.

## 2016-05-17 ENCOUNTER — Other Ambulatory Visit: Payer: Self-pay | Admitting: Internal Medicine

## 2016-05-17 DIAGNOSIS — Z1231 Encounter for screening mammogram for malignant neoplasm of breast: Secondary | ICD-10-CM

## 2016-05-31 ENCOUNTER — Ambulatory Visit
Admission: RE | Admit: 2016-05-31 | Discharge: 2016-05-31 | Disposition: A | Discharge status: Home / Self Care | Payer: 59 | Source: Ambulatory Visit | Attending: Internal Medicine | Admitting: Internal Medicine

## 2016-05-31 DIAGNOSIS — Z1231 Encounter for screening mammogram for malignant neoplasm of breast: Secondary | ICD-10-CM

## 2016-12-29 ENCOUNTER — Other Ambulatory Visit: Payer: Self-pay | Admitting: Rheumatology

## 2016-12-29 DIAGNOSIS — R7989 Other specified abnormal findings of blood chemistry: Secondary | ICD-10-CM

## 2016-12-29 DIAGNOSIS — R945 Abnormal results of liver function studies: Principal | ICD-10-CM

## 2017-01-06 ENCOUNTER — Other Ambulatory Visit: Payer: 59

## 2017-01-10 ENCOUNTER — Ambulatory Visit
Admission: RE | Admit: 2017-01-10 | Discharge: 2017-01-10 | Disposition: A | Discharge status: Home / Self Care | Payer: 59 | Source: Ambulatory Visit | Attending: Rheumatology | Admitting: Rheumatology

## 2017-01-10 DIAGNOSIS — R945 Abnormal results of liver function studies: Principal | ICD-10-CM

## 2017-01-10 DIAGNOSIS — R7989 Other specified abnormal findings of blood chemistry: Secondary | ICD-10-CM

## 2017-06-18 ENCOUNTER — Encounter: Payer: Self-pay | Admitting: Physician Assistant

## 2017-06-18 ENCOUNTER — Ambulatory Visit (INDEPENDENT_AMBULATORY_CARE_PROVIDER_SITE_OTHER): Payer: 59

## 2017-06-18 ENCOUNTER — Ambulatory Visit (INDEPENDENT_AMBULATORY_CARE_PROVIDER_SITE_OTHER): Payer: 59 | Admitting: Physician Assistant

## 2017-06-18 VITALS — BP 144/90 | HR 88 | Resp 16 | Ht 63.0 in | Wt 204.6 lb

## 2017-06-18 DIAGNOSIS — M25561 Pain in right knee: Secondary | ICD-10-CM

## 2017-06-18 DIAGNOSIS — R937 Abnormal findings on diagnostic imaging of other parts of musculoskeletal system: Secondary | ICD-10-CM

## 2017-06-18 DIAGNOSIS — M1712 Unilateral primary osteoarthritis, left knee: Secondary | ICD-10-CM

## 2017-06-18 NOTE — Progress Notes (Signed)
06/19/2017 10:10 AM   DOB: 11/07/1950 / MRN: [id number]  SUBJECTIVE:  Ann Rodgers is a 66 y.o. female presenting for right knee pain that is new for about weeks now.  This is worse with working on hard floors.  Tells me the knee is stiff in the morning and loosens up with movement.  She thinks that she is getting worse. She has a history of lupus but does not take any PO therapies for this.  She sees rheumatology and tells me "I've always done okay with it."  She is allergic to codeine and sulfa antibiotics.   She  has a past medical history of GERD (gastroesophageal reflux disease); Hypertension; Lupus; and Thyroid disease.    She  reports that she quit smoking about 2 years ago. She has never used smokeless tobacco. She reports that she does not drink alcohol or use drugs. She  has no sexual activity history on file. The patient  has a past surgical history that includes Breast lumpectomy.  Her family history is not on file.  Review of Systems  Constitutional: Negative for chills, diaphoresis and fever.  Respiratory: Negative for shortness of breath.   Cardiovascular: Negative for chest pain, orthopnea and leg swelling.  Gastrointestinal: Negative for nausea.  Musculoskeletal: Positive for joint pain. Negative for back pain, falls, myalgias and neck pain.  Skin: Negative for rash.  Neurological: Negative for dizziness.    The problem list and medications were reviewed and updated by myself where necessary and exist elsewhere in the encounter.   OBJECTIVE:  BP (!) 144/90 (BP Location: Left Arm, Patient Position: Sitting, Cuff Size: Normal)   Pulse 88   Resp 16   Ht 5\' 3"  (1.6 m)   Wt 204 lb 9.6 oz (92.8 kg)   SpO2 98%   BMI 36.24 kg/m   Physical Exam  Constitutional: She is oriented to person, place, and time. She is active.  Non-toxic appearance.  Eyes: Pupils are equal, round, and reactive to light. EOM are normal.  Cardiovascular: Normal rate, regular rhythm, S1  normal, S2 normal, normal heart sounds and intact distal pulses.  Exam reveals no gallop, no friction rub and no decreased pulses.   No murmur heard. Pulmonary/Chest: Effort normal. No stridor. No tachypnea. No respiratory distress. She has no wheezes. She has no rales.  Abdominal: She exhibits no distension.  Musculoskeletal: She exhibits no edema.  Neurological: She is alert and oriented to person, place, and time. She has normal strength and normal reflexes. She is not disoriented. She displays no atrophy. No cranial nerve deficit or sensory deficit. She exhibits normal muscle tone. Coordination and gait normal.  Skin: Skin is warm and dry. She is not diaphoretic. No pallor.  Psychiatric: Her behavior is normal.    No results found for this or any previous visit (from the past 72 hour(s)).  Dg Knee Complete 4 Views Left  Result Date: 06/18/2017 CLINICAL DATA:  Left knee pain for 2 weeks. EXAM: LEFT KNEE - COMPLETE 4+ VIEW COMPARISON:  None. FINDINGS: No evidence of fracture, dislocation, or joint effusion. No evidence of arthropathy. Wispy macrolobulated sclerotic changes within the distal femur are most consistent with bone infarct. Soft tissues are unremarkable. IMPRESSION: No acute fracture or dislocation identified about the left knee. 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 Electronically Signed   By: Fidela Salisbury M.D.   On: 06/18/2017 15:43    ASSESSMENT AND PLAN:  Lakashia was seen today for knee pain.  Diagnoses and all orders for this visit:  Acute pain of right knee: Rads negative for arthritis however clinically arthritis is likely given HPI.  Rad do show some potential osteonecrosis.  Will CT the area of concern.  Should this finding persist I will refer to orthopedic for further care. She will discuss the findings with her rheumatologist as these bone changes are most likely 2/2 Lupus.    -     DG Knee Complete 4 Views Left; Future  Abnormal x-ray of  bone -     CT KNEE LEFT WO CONTRAST; Future    The patient is advised to call or return to clinic if she does not see an improvement in symptoms, or to seek the care of the closest emergency department if she worsens with the above plan.   Philis Fendt, MHS, PA-C [facility] 06/19/2017 10:10 AM

## 2017-06-18 NOTE — Patient Instructions (Addendum)
  Dg Knee Complete 4 Views Left  Result Date: 06/18/2017 CLINICAL DATA:  Left knee pain for 2 weeks. EXAM: LEFT KNEE - COMPLETE 4+ VIEW COMPARISON:  None. FINDINGS: No evidence of fracture, dislocation, or joint effusion. No evidence of arthropathy. Wispy macrolobulated sclerotic changes within the distal femur are most consistent with bone infarct. Soft tissues are unremarkable. IMPRESSION: No acute fracture or dislocation identified about the left knee. 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 Electronically Signed   By: Fidela Salisbury M.D.   On: 06/18/2017 15:43     IF you received an x-ray today, you will receive an invoice from [facility]. Please contact [facility] at [phone] with questions or concerns regarding your invoice.   IF you received labwork today, you will receive an invoice from [facility]. Please contact [facility] at [phone] with questions or concerns regarding your invoice.   Our billing staff will not be able to assist you with questions regarding bills from these companies.  You will be contacted with the lab results as soon as they are available. The fastest way to get your results is to activate your My Chart account. Instructions are located on the last page of this paperwork. If you have not heard from [place] regarding the results in 2 weeks, please contact this office.

## 2017-06-20 ENCOUNTER — Other Ambulatory Visit: Payer: Self-pay | Admitting: Internal Medicine

## 2017-06-20 DIAGNOSIS — Z1239 Encounter for other screening for malignant neoplasm of breast: Secondary | ICD-10-CM

## 2017-08-01 ENCOUNTER — Ambulatory Visit
Admission: RE | Admit: 2017-08-01 | Discharge: 2017-08-01 | Disposition: A | Discharge status: Home / Self Care | Payer: 59 | Source: Ambulatory Visit | Attending: Internal Medicine | Admitting: Internal Medicine

## 2017-08-01 DIAGNOSIS — Z1239 Encounter for other screening for malignant neoplasm of breast: Secondary | ICD-10-CM

## 2018-01-19 ENCOUNTER — Emergency Department (HOSPITAL_COMMUNITY)
Admission: EM | Admit: 2018-01-19 | Discharge: 2018-01-19 | Disposition: A | Discharge status: Home / Self Care | Payer: POS | Attending: Emergency Medicine | Admitting: Emergency Medicine

## 2018-01-19 ENCOUNTER — Other Ambulatory Visit: Payer: Self-pay

## 2018-01-19 ENCOUNTER — Encounter (HOSPITAL_COMMUNITY): Payer: Self-pay | Admitting: Nurse Practitioner

## 2018-01-19 DIAGNOSIS — Z7982 Long term (current) use of aspirin: Secondary | ICD-10-CM | POA: Insufficient documentation

## 2018-01-19 DIAGNOSIS — Z79899 Other long term (current) drug therapy: Secondary | ICD-10-CM | POA: Diagnosis not present

## 2018-01-19 DIAGNOSIS — I10 Essential (primary) hypertension: Secondary | ICD-10-CM | POA: Diagnosis not present

## 2018-01-19 DIAGNOSIS — G510 Bell's palsy: Secondary | ICD-10-CM | POA: Insufficient documentation

## 2018-01-19 DIAGNOSIS — Z87891 Personal history of nicotine dependence: Secondary | ICD-10-CM | POA: Diagnosis not present

## 2018-01-19 DIAGNOSIS — R29810 Facial weakness: Secondary | ICD-10-CM | POA: Diagnosis present

## 2018-01-19 HISTORY — DX: Fibromyalgia: M79.7

## 2018-01-19 HISTORY — DX: Bell's palsy: G51.0

## 2018-01-19 LAB — URINALYSIS, ROUTINE W REFLEX MICROSCOPIC
Bilirubin Urine: NEGATIVE
Glucose, UA: NEGATIVE mg/dL
Hgb urine dipstick: NEGATIVE
KETONES UR: 5 mg/dL — AB
LEUKOCYTES UA: NEGATIVE
Nitrite: NEGATIVE
PH: 7 (ref 5.0–8.0)
PROTEIN: NEGATIVE mg/dL
Specific Gravity, Urine: 1.014 (ref 1.005–1.030)

## 2018-01-19 MED ORDER — ARTIFICIAL TEARS OPHTHALMIC OINT: TOPICAL_OINTMENT | Freq: Three times a day (TID) | OPHTHALMIC | 0 refills | Status: DC | PRN

## 2018-01-19 MED ORDER — METHYLPREDNISOLONE 4 MG PO TBPK: ORAL_TABLET | ORAL | 0 refills | Status: DC

## 2018-01-19 NOTE — ED Triage Notes (Signed)
Patient has a history of bells palsy on her right side. Called MD and they sent her here to rule out possible stroke. Patient states she thinks it is bells palsy but its on the left side now. States it feels the same. No weakness in her extremities can speak clearly with no problems. Patient states her left side of her face is tingling and her eye is watering and her ear has been hurting. Patient drove self to ER with no problems.

## 2018-01-19 NOTE — Discharge Instructions (Addendum)
Please read instructions below. Take the steroid as directed. Schedule an appointment with the neurologist to follow up on your diagnosis of bells palsy. Return to the ER for headache, vision changes, new weakness or numbness, or new or concerning symptoms.

## 2018-01-19 NOTE — ED Notes (Signed)
ED Provider at bedside. 

## 2018-01-19 NOTE — ED Provider Notes (Signed)
[facility] DEPT Provider Note   CSN: [id number] Arrival date & time: 01/19/18  0930     History   Chief Complaint No chief complaint on file.   HPI Ann Rodgers is a 67 y.o. female w past medical history of lupus, GERD, Bell's palsy, hypertension, presenting to the ED with left-sided facial droop that began on Sunday.  Patient states symptoms feel very similar to her history of Bell's palsy on the right which resolved.  She states she feels as though her left eye is watery and that her mouth is twisted.  She reports last week she came down with a sinus infection and was following up with her primary care provider who recommended she report to the ED to evaluate for possible stroke.  She denies headache, vision changes, numbness or weakness in extremities, confusion, ataxia.  Patient's daughter accompanies her and states she has been acting appropriately though just has left-sided facial droop which is causing her speech to be slightly slurred.  No history of CVA.  Not on anticoagulation.  The history is provided by the patient and a relative.    Past Medical History:  Diagnosis Date  . Bell palsy   . Fibromyalgia   . GERD (gastroesophageal reflux disease)   . Hypertension   . Lupus ([place])   . Thyroid disease     Patient Active Problem List   Diagnosis Date Noted  . Essential hypertension, benign 02/28/2013  . Other and unspecified hyperlipidemia 02/28/2013  . Lupus ([place]) 02/28/2013  . Anxiety state, unspecified 02/28/2013    Past Surgical History:  Procedure Laterality Date  . AUGMENTATION MAMMAPLASTY    . BREAST EXCISIONAL BIOPSY    . BREAST LUMPECTOMY     x2     OB History   None      Home Medications    Prior to Admission medications   Medication Sig Start Date End Date Taking? Authorizing Provider  amLODipine (NORVASC) 5 MG tablet Take 5 mg by mouth at bedtime. 01/03/18  Yes [provider]  aspirin EC 81 MG tablet  Take 81 mg by mouth daily.   Yes [provider]  calcium carbonate (OSCAL) 1500 (600 Ca) MG TABS tablet Take 1 tablet by mouth daily.   Yes [provider]  cefdinir (OMNICEF) 300 MG capsule Take 300 mg by mouth 2 (two) times daily. 01/15/18  Yes [provider]  cyclobenzaprine (FLEXERIL) 5 MG tablet Take 5 mg by mouth 3 (three) times daily as needed for muscle spasms. 12/29/17  Yes [provider]  gabapentin (NEURONTIN) 100 MG capsule Take 200 mg by mouth at bedtime.  08/11/15  Yes [provider]  ibuprofen (ADVIL,MOTRIN) 800 MG tablet Take 800 mg by mouth every 8 (eight) hours as needed for mild pain.   Yes [provider]  losartan-hydrochlorothiazide (HYZAAR) 100-12.5 MG tablet Take 1 tablet by mouth daily. 12/20/17  Yes [provider]  Propylene Glycol (SYSTANE COMPLETE OP) Place 1 drop into the left eye every 2 (two) hours as needed (dry eye).   Yes [provider]  SYNTHROID 75 MCG tablet Take 75 mcg by mouth daily before breakfast.  07/28/15  Yes [provider]  artificial tears (LACRILUBE) OINT ophthalmic ointment Place into the left eye 3 (three) times daily as needed for dry eyes. 01/19/18   Robinson, [place] N, PA-C  methylPREDNISolone (MEDROL DOSEPAK) 4 MG TBPK tablet Follow administration instructions on packaging. 01/19/18   Robinson, [place] N,  PA-C    Family History Family History  Problem Relation Age of Onset  . Colon cancer Neg Hx     Social History Social History   Tobacco Use  . Smoking status: Former Smoker    Last attempt to quit: 08/06/2014    Years since quitting: 3.4  . Smokeless tobacco: Never Used  Substance Use Topics  . Alcohol use: No    Alcohol/week: 0.0 oz  . Drug use: No     Allergies   Codeine; Sulfa antibiotics; and Penicillins   Review of Systems Review of Systems  Constitutional: Negative for fever.  Neurological: Positive for facial asymmetry and speech  difficulty. Negative for weakness, numbness and headaches.  Hematological: Does not bruise/bleed easily.  Psychiatric/Behavioral: Negative for confusion.  All other systems reviewed and are negative.    Physical Exam Updated Vital Signs BP (!) 149/93   Pulse (!) 103   Temp 97.9 F (36.6 C) (Oral)   Resp 17   Ht 5\' 3"  (1.6 m)   Wt 91.6 kg (202 lb)   SpO2 100%   BMI 35.78 kg/m   Physical Exam  Constitutional: She appears well-developed and well-nourished. No distress.  Well-appearing.  HENT:  Head: Normocephalic and atraumatic.  Eyes: Pupils are equal, round, and reactive to light. Conjunctivae and EOM are normal.  Neck: Normal range of motion. Neck supple.  Cardiovascular: Normal rate, regular rhythm and intact distal pulses.  Pulmonary/Chest: Effort normal and breath sounds normal. No respiratory distress.  Abdominal: Soft. Bowel sounds are normal. There is no tenderness.  Neurological: She is alert.  Mental Status:  Alert, oriented, thought content appropriate, able to give a coherent history. Speech is slightly slurred with left facial droop. No evidence of aphasia. Able to follow 2 step commands without difficulty.  Cranial Nerves:  II:  Peripheral visual fields grossly normal, pupils equal, round, reactive to light III,IV, VI: ptosis present on the left, extra-ocular motions intact bilaterally  V,VII: smile asymmetric with droop on left, asymmetric eyebrow raise (left without raise), facial light touch sensation equal VIII: hearing grossly normal to voice  X: uvula elevates symmetrically  XI: bilateral shoulder shrug symmetric and strong XII: tongue extension towards right without fassiculations Motor:  Normal tone. 5/5 in upper and lower extremities bilaterally including strong and equal grip strength and dorsiflexion/plantar flexion Sensory: Pinprick and light touch normal in all extremities.  Deep Tendon Reflexes: 2+ and symmetric in the biceps and  patella Cerebellar: normal finger-to-nose with bilateral upper extremities. Negative rhomberg Gait: normal gait and balance CV: distal pulses palpable throughout    Skin: Skin is warm.  Psychiatric: She has a normal mood and affect. Her behavior is normal.  Nursing note and vitals reviewed.    ED Treatments / Results  Labs (all labs ordered are listed, but only abnormal results are displayed) Labs Reviewed  URINALYSIS, ROUTINE W REFLEX MICROSCOPIC - Abnormal; Notable for the following components:      Result Value   Ketones, ur 5 (*)    All other components within normal limits    EKG None  Radiology No results found.  Procedures Procedures (including critical care time)  Medications Ordered in ED Medications - No data to display   Initial Impression / Assessment and Plan / ED Course  I have reviewed the triage vital signs and the nursing notes.  Pertinent labs & imaging results that were available during my care of the patient were reviewed by me and considered in my medical decision  making (see chart for details).  Clinical Course as of Jan 19 1441  Thu Jan 19, 2018  1323 Dr. Leonette Monarch evaluated pt. Agrees with bells palsy diagnosis. No imaging or labs indicated. Will rx medrol dose pack and advise neuro follow up.   [JR]    Clinical Course User Index [JR] Robinson, [place] N, PA-C   Patient with history and presentation consistent with Bell's palsy; sx since "Sunday. Recent viral illness. Left-sided facial droop that does not spare left eyebrow raise. No other neuro deficits. Pt w PMHx bells palsy and pt states it feels similar. Exam not consistent with lupus cerebritis or stroke. Pt discussed with and evaluated by Dr. Cardama, who agrees with diagnosis. Do not feel imaging in necessary. Pt agrees with plan. Medrol dose pack prescribed and eye lubrication ointment. Neuro referral provided for follow up . Strict return precautions discussed. Safe for  discharge.  Discussed results, findings, treatment and follow up. Patient advised of return precautions. Patient verbalized understanding and agreed with plan.  Final Clinical Impressions(s) / ED Diagnoses   Final diagnoses:  Left-sided Bell's palsy    ED Discharge Orders        Ordered    methylPREDNISolone (MEDROL DOSEPAK) 4 MG TBPK tablet     01/19/18 1349    artificial tears (LACRILUBE) OINT ophthalmic ointment  3 times daily PRN     05" /16/19 Man, [place] N, [place] 01/19/18 1443    Fatima Blank, MD 01/20/18 2242

## 2018-01-25 ENCOUNTER — Encounter: Payer: Self-pay | Admitting: *Deleted

## 2018-01-25 ENCOUNTER — Telehealth: Payer: Self-pay | Admitting: Diagnostic Neuroimaging

## 2018-01-25 ENCOUNTER — Encounter: Payer: Self-pay | Admitting: Diagnostic Neuroimaging

## 2018-01-25 ENCOUNTER — Ambulatory Visit (INDEPENDENT_AMBULATORY_CARE_PROVIDER_SITE_OTHER): Payer: 59 | Admitting: Diagnostic Neuroimaging

## 2018-01-25 VITALS — BP 142/92 | HR 88 | Ht 63.0 in | Wt 202.4 lb

## 2018-01-25 DIAGNOSIS — G510 Bell's palsy: Secondary | ICD-10-CM

## 2018-01-25 NOTE — Patient Instructions (Signed)
-   check MRI brain  - check labs  - eye care reviewed  - may return to work may 28th, no restrictions

## 2018-01-25 NOTE — Progress Notes (Signed)
[facility]  PATIENT: Ann Rodgers DOB: Oct 31, 1950  REFERRING CLINICIAN: ER  HISTORY FROM: patient  REASON FOR VISIT: new consult    HISTORICAL  CHIEF COMPLAINT:  Chief Complaint  Patient presents with  . NP  ER Referral    Rm 7, alone.  [place] Palsy   Left sided    Finished prednisone and cefdinir yesterday.  she feels like she is getting better, but still with eye strain.  Is using patch at night, with lacrilube tid left eye. Has hx of R sided bells palsy 10 yrs ago.     HISTORY OF PRESENT ILLNESS:   67 year old female here for evaluation of left-sided Bell's palsy.  Patient has history of lupus, hypertension, fibromyalgia.  Has history of right-sided Bell's palsy from 2009 with full recovery.  01/15/2018 patient noticed onset of left-sided facial weakness and left eye problems.  Patient had some discomfort around her left ear.  By Jan 20, 2008 patient went to the emergency room for evaluation, diagnosed with left-sided Bell's palsy, treated with prednisone (dose completed yesterday).   Since that time symptoms are slightly improving.  Patient is using a patch over her left eye at nighttime.  She is using artificial tears and Lacri-Lube as well.  No problems with her left arm the left leg.  No numbness or tingling.  She has some left eye vision problems.  No slurred speech or trouble talking.  No recent infections, traumas, accidents, triggering factors.   REVIEW OF SYSTEMS: Full 14 system review of systems performed and negative with exception of: Blurred vision.  ALLERGIES: Allergies  Allergen Reactions  . Codeine     "hot fever" and abdominal pain  . Sulfa Antibiotics Nausea And Vomiting  . Penicillins Rash    Has patient had a PCN reaction causing immediate rash, facial/tongue/throat swelling, SOB or lightheadedness with hypotension: NO Has patient had a PCN reaction causing severe rash involving mucus membranes or skin necrosis: NO Has  patient had a PCN reaction that required hospitalization: NO Has patient had a PCN reaction occurring within the last 10 years: NO If all of the above answers are "NO", then may proceed with Cephalosporin use.    HOME MEDICATIONS: Outpatient Medications Prior to Visit  Medication Sig Dispense Refill  . amLODipine (NORVASC) 5 MG tablet Take 5 mg by mouth at bedtime.    Marland Kitchen artificial tears (LACRILUBE) OINT ophthalmic ointment Place into the left eye 3 (three) times daily as needed for dry eyes. 5 g 0  . aspirin EC 81 MG tablet Take 81 mg by mouth daily.    . calcium carbonate (OSCAL) 1500 (600 Ca) MG TABS tablet Take 1 tablet by mouth daily.    . cyclobenzaprine (FLEXERIL) 5 MG tablet Take 5 mg by mouth 3 (three) times daily as needed for muscle spasms.  0  . gabapentin (NEURONTIN) 100 MG capsule Take 200 mg by mouth at bedtime.     Marland Kitchen ibuprofen (ADVIL,MOTRIN) 800 MG tablet Take 800 mg by mouth every 8 (eight) hours as needed for mild pain.    Marland Kitchen losartan-hydrochlorothiazide (HYZAAR) 100-12.5 MG tablet Take 1 tablet by mouth daily.    Marland Kitchen Propylene Glycol (SYSTANE COMPLETE OP) Place 1 drop into the left eye every 2 (two) hours as needed (dry eye).    . SYNTHROID 75 MCG tablet Take 75 mcg by mouth daily before breakfast.     . cefdinir (OMNICEF) 300 MG capsule Take 300 mg by mouth 2 (two)  times daily.  0  . methylPREDNISolone (MEDROL DOSEPAK) 4 MG TBPK tablet Follow administration instructions on packaging. (Patient not taking: Reported on 01/25/2018) 21 tablet 0   No facility-administered medications prior to visit.     PAST MEDICAL HISTORY: Past Medical History:  Diagnosis Date  . Bell palsy   . Fibromyalgia   . GERD (gastroesophageal reflux disease)   . Hypertension   . Lupus ([place])   . Thyroid disease     PAST SURGICAL HISTORY: Past Surgical History:  Procedure Laterality Date  . AUGMENTATION MAMMAPLASTY    . BREAST EXCISIONAL BIOPSY    . BREAST LUMPECTOMY     x2    FAMILY  HISTORY: Family History  Problem Relation Age of Onset  . Stroke Mother   . Colon cancer Neg Hx     SOCIAL HISTORY:  Social History   Socioeconomic History  . Marital status: Married    Spouse name: Not on file  . Number of children: Not on file  . Years of education: Not on file  . Highest education level: Not on file  Occupational History  . Not on file  Social Needs  . Financial resource strain: Not on file  . Food insecurity:    Worry: Not on file    Inability: Not on file  . Transportation needs:    Medical: Not on file    Non-medical: Not on file  Tobacco Use  . Smoking status: Former Smoker    Last attempt to quit: 08/06/2014    Years since quitting: 3.4  . Smokeless tobacco: Never Used  Substance and Sexual Activity  . Alcohol use: No    Alcohol/week: 0.0 oz    Comment: quit 2010  . Drug use: No  . Sexual activity: Not Currently  Lifestyle  . Physical activity:    Days per week: Not on file    Minutes per session: Not on file  . Stress: Not on file  Relationships  . Social connections:    Talks on phone: Not on file    Gets together: Not on file    Attends religious service: Not on file    Active member of club or organization: Not on file    Attends meetings of clubs or organizations: Not on file    Relationship status: Not on file  . Intimate partner violence:    Fear of current or ex partner: Not on file    Emotionally abused: Not on file    Physically abused: Not on file    Forced sexual activity: Not on file  Other Topics Concern  . Not on file  Social History Narrative   Lives home with husband.  Works for [place] [employer].  Education 2 yr college.  2 Children.   Caffeine 1/ mo decaff.  1 soda per week (cheerwine).     PHYSICAL EXAM  GENERAL EXAM/CONSTITUTIONAL: Vitals:  Vitals:   01/25/18 0814  BP: (!) 142/92  Pulse: 88  Weight: 202 lb 6.4 oz (91.8 kg)  Height: 5\' 3"  (1.6 m)     Body mass index is 35.85 kg/m.  Visual Acuity  Screening   Right eye Left eye Both eyes  Without correction: 20/30 20/70   With correction:        Patient is in no distress; well developed, nourished and groomed; neck is supple  CARDIOVASCULAR:  Examination of carotid arteries is normal; no carotid bruits  Regular rate and rhythm, no murmurs  Examination of peripheral vascular  system by observation and palpation is normal  EYES:  Ophthalmoscopic exam of optic discs and posterior segments is normal; no papilledema or hemorrhages  MUSCULOSKELETAL:  Gait, strength, tone, movements noted in Neurologic exam below  NEUROLOGIC: MENTAL STATUS:  No flowsheet data found.  awake, alert, oriented to person, place and time  recent and remote memory intact  normal attention and concentration  language fluent, comprehension intact, naming intact,   fund of knowledge appropriate  CRANIAL NERVE:   2nd - no papilledema on fundoscopic exam  2nd, 3rd, 4th, 6th - pupils equal and reactive to light, visual fields full to confrontation, extraocular muscles intact, no nystagmus  5th - facial sensation symmetric  7th - facial strength --> DECR EYE BROW RAISE BILATERALLY (LEFT WORSE THAN RIGHT); DECR LEFT LOWER FACIAL STRENGTH; DECR LEFT EYE CLOSURE  8th - hearing intact  9th - palate elevates symmetrically, uvula midline  11th - shoulder shrug symmetric  12th - tongue protrusion midline  MOTOR:   normal bulk and tone, full strength in the BUE, BLE  SENSORY:   normal and symmetric to light touch, temperature, vibration  COORDINATION:   finger-nose-finger, fine finger movements normal  REFLEXES:   deep tendon reflexes TRACE and symmetric  GAIT/STATION:   narrow based gait    DIAGNOSTIC DATA (LABS, IMAGING, TESTING) - I reviewed patient records, labs, notes, testing and imaging myself where available.  No results found for: WBC, HGB, HCT, MCV, PLT No results found for: NA, K, CL, CO2, GLUCOSE, BUN,  CREATININE, CALCIUM, PROT, ALBUMIN, AST, ALT, ALKPHOS, BILITOT, GFRNONAA, GFRAA No results found for: CHOL, HDL, LDLCALC, LDLDIRECT, TRIG, CHOLHDL No results found for: HGBA1C No results found for: VITAMINB12 No results found for: TSH      ASSESSMENT AND PLAN  67 y.o. year old female here with recurrent Bell's palsy, right side in 2009, left side in 2018, history of lupus, and possible Sjogren's disease according to patient.  We will proceed with further work-up.  Dx: recurrent bell's palsy (right side in 2009; left side in 2019); h/o lupus +/- sjogren's   1. Left-sided Bell's palsy      PLAN:  - check MRI brain w/wo (with IAC) - check labs (lyme, ACE, SSA, HIV) - eye care reviewed - may return to work may 28th, no restrictions  Orders Placed This Encounter  Procedures  . MR BRAIN/IAC W WO CONTRAST  . Lyme Ab/Western Blot Reflex  . HIV antibody (with reflex)  . Sjogren's syndrome antibods(ssa + ssb)  . Angiotensin converting enzyme   Return if symptoms worsen or fail to improve, for return to PCP and rheumatology. pending test results    Penni Bombard, MD 1/61/0960, 4:54 AM Certified in Neurology, Neurophysiology and [facility] [address], [place],  [address] [phone]

## 2018-01-25 NOTE — Telephone Encounter (Signed)
[employer] order sent to GI. They obtain the auth and will reach out to the pt to schedule.  °

## 2018-01-26 LAB — ANGIOTENSIN CONVERTING ENZYME: Angio Convert Enzyme: 57 U/L (ref 14–82)

## 2018-01-26 LAB — LYME AB/WESTERN BLOT REFLEX: Lyme IgG/IgM Ab: <0.91 {ISR} (ref 0.00–0.90)

## 2018-01-26 LAB — SJOGREN'S SYNDROME ANTIBODS(SSA + SSB)
ENA SSA (RO) Ab: 3.2 AI — ABNORMAL HIGH (ref 0.0–0.9)
ENA SSB (LA) AB: 0.2 AI (ref 0.0–0.9)

## 2018-01-26 LAB — HIV ANTIBODY (ROUTINE TESTING W REFLEX): HIV SCREEN 4TH GENERATION: NONREACTIVE

## 2018-01-27 ENCOUNTER — Telehealth: Payer: Self-pay | Admitting: *Deleted

## 2018-01-27 NOTE — Telephone Encounter (Signed)
LVM informing patient that she had normal labs except SSA elevated. Advised this could be related to her sjogren's disease.  Advised Dr Leta Baptist wants her to be sure she follows up with her rheumatologist. Left number for any questions.

## 2018-02-06 ENCOUNTER — Other Ambulatory Visit: Payer: 59

## 2018-02-08 ENCOUNTER — Telehealth: Payer: Self-pay | Admitting: Diagnostic Neuroimaging

## 2018-02-08 NOTE — Telephone Encounter (Signed)
Noted  

## 2018-02-08 NOTE — Telephone Encounter (Signed)
I spoke to Ann Rodgers she is aware that the MRI has been approved.

## 2018-02-08 NOTE — Telephone Encounter (Signed)
I called for approval. I spoke with Dr. Rolly Salter (direct number [phone]) who approved scan. Written approval should arrive by tomorrow.   Penni Bombard, MD 03/06/5952, 9:67 PM Certified in Neurology, Neurophysiology and Neuroimaging  [facility] [address] [place], [place] [address] [phone]

## 2018-02-08 NOTE — Telephone Encounter (Signed)
[facility] with [facility] informed me that Ann Rodgers did not approve the MRI Brain. The reference number is [id number]. The phone number for the peer to peer is [phone]. Her appointment is right now scheduled at [facility] for 02/13/18.

## 2018-02-13 ENCOUNTER — Ambulatory Visit
Admission: RE | Admit: 2018-02-13 | Discharge: 2018-02-13 | Disposition: A | Discharge status: Home / Self Care | Payer: POS | Source: Ambulatory Visit | Attending: Diagnostic Neuroimaging | Admitting: Diagnostic Neuroimaging

## 2018-02-13 DIAGNOSIS — G510 Bell's palsy: Secondary | ICD-10-CM

## 2018-02-13 MED ORDER — GADOBENATE DIMEGLUMINE 529 MG/ML IV SOLN
19.0000 mL | Freq: Once | INTRAVENOUS | Status: AC | PRN
  Administered 2018-02-13: 19 mL via INTRAVENOUS

## 2018-09-11 ENCOUNTER — Other Ambulatory Visit: Payer: Self-pay | Admitting: Internal Medicine

## 2018-09-11 DIAGNOSIS — Z1231 Encounter for screening mammogram for malignant neoplasm of breast: Secondary | ICD-10-CM

## 2018-10-09 ENCOUNTER — Ambulatory Visit: Payer: POS

## 2018-10-25 ENCOUNTER — Encounter: Payer: Self-pay | Admitting: Internal Medicine

## 2018-11-13 ENCOUNTER — Ambulatory Visit
Admission: RE | Admit: 2018-11-13 | Discharge: 2018-11-13 | Disposition: A | Discharge status: Home / Self Care | Payer: POS | Source: Ambulatory Visit | Attending: Internal Medicine | Admitting: Internal Medicine

## 2018-11-13 DIAGNOSIS — Z1231 Encounter for screening mammogram for malignant neoplasm of breast: Secondary | ICD-10-CM

## 2019-10-09 ENCOUNTER — Ambulatory Visit: Payer: POS | Attending: Internal Medicine

## 2019-10-09 DIAGNOSIS — Z20822 Contact with and (suspected) exposure to covid-19: Secondary | ICD-10-CM

## 2019-10-10 ENCOUNTER — Telehealth: Payer: Self-pay | Admitting: General Practice

## 2019-10-10 LAB — NOVEL CORONAVIRUS, NAA: SARS-CoV-2, NAA: NOT DETECTED

## 2019-10-10 NOTE — Telephone Encounter (Signed)
Gave patient negative covid test results Patient understood 

## 2019-11-19 ENCOUNTER — Encounter: Payer: Self-pay | Admitting: Internal Medicine

## 2019-12-25 ENCOUNTER — Other Ambulatory Visit: Payer: Self-pay

## 2019-12-25 ENCOUNTER — Ambulatory Visit (AMBULATORY_SURGERY_CENTER): Payer: Self-pay | Admitting: *Deleted

## 2019-12-25 VITALS — Temp 97.3°F | Ht 63.0 in | Wt 208.0 lb

## 2019-12-25 DIAGNOSIS — Z86010 Personal history of colonic polyps: Secondary | ICD-10-CM

## 2019-12-25 MED ORDER — NA SULFATE-K SULFATE-MG SULF 17.5-3.13-1.6 GM/177ML PO SOLN: ORAL | 0 refills | Status: DC

## 2019-12-25 NOTE — Progress Notes (Signed)
Patient is here in-person for PV. Patient denies any allergies to eggs or soy. Patient denies any problems with anesthesia/sedation. Patient denies any oxygen use at home. Patient denies taking any diet/weight loss medications or blood thinners. Patient is not being treated for MRSA or C-diff. EMMI education assisgned to the patient for the procedure, this was explained and instructions given to patient. Patient is aware of our care-partner policy and 0000000 safety protocol. Pt has completed covid vaccines on 11/16/2019.   Prep Prescription coupon given to the patient.

## 2020-01-04 ENCOUNTER — Encounter: Payer: Self-pay | Admitting: Internal Medicine

## 2020-01-08 ENCOUNTER — Other Ambulatory Visit: Payer: Self-pay

## 2020-01-08 ENCOUNTER — Ambulatory Visit (AMBULATORY_SURGERY_CENTER): Payer: POS | Admitting: Internal Medicine

## 2020-01-08 ENCOUNTER — Encounter: Payer: Self-pay | Admitting: Internal Medicine

## 2020-01-08 VITALS — BP 131/74 | HR 87 | Temp 95.5°F | Resp 19 | Ht 63.0 in | Wt 208.0 lb

## 2020-01-08 DIAGNOSIS — Z86010 Personal history of colonic polyps: Secondary | ICD-10-CM

## 2020-01-08 DIAGNOSIS — D122 Benign neoplasm of ascending colon: Secondary | ICD-10-CM | POA: Diagnosis not present

## 2020-01-08 DIAGNOSIS — D125 Benign neoplasm of sigmoid colon: Secondary | ICD-10-CM | POA: Diagnosis not present

## 2020-01-08 MED ORDER — SODIUM CHLORIDE 0.9 % IV SOLN: 500.0000 mL | Freq: Once | INTRAVENOUS | Status: DC

## 2020-01-08 NOTE — Progress Notes (Signed)
Pt's states no medical or surgical changes since previsit or office visit.  Jonnie Kind, VS DT

## 2020-01-08 NOTE — Progress Notes (Signed)
pt tolerated well. VSS. awake and to recovery. Report given to RN.  

## 2020-01-08 NOTE — Progress Notes (Signed)
Called to room to assist during endoscopic procedure.  Patient ID and intended procedure confirmed with present staff. Received instructions for my participation in the procedure from the performing physician.  

## 2020-01-08 NOTE — Op Note (Signed)
[facility] Patient Name: Ann Rodgers Procedure Date: 01/08/2020 9:22 AM MRN: [id number] Endoscopist: Docia Chuck. Henrene Pastor , MD Age: 69 Referring MD:  Date of Birth: 03-Aug-1951 Gender: Female Account #: [id number] Procedure:                Colonoscopy with cold snare polypectomy x 2 Indications:              High risk colon cancer surveillance: Personal                            history of multiple (3 or more) adenomas. Index                            exam December 2016 with 5 adenomas. Medicines:                Monitored Anesthesia Care Procedure:                Pre-Anesthesia Assessment:                           - Prior to the procedure, a History and Physical                            was performed, and patient medications and                            allergies were reviewed. The patient's tolerance of                            previous anesthesia was also reviewed. The risks                            and benefits of the procedure and the sedation                            options and risks were discussed with the patient.                            All questions were answered, and informed consent                            was obtained. Prior Anticoagulants: The patient has                            taken no previous anticoagulant or antiplatelet                            agents. ASA Grade Assessment: II - A patient with                            mild systemic disease. After reviewing the risks                            and benefits, the patient was deemed in  satisfactory condition to undergo the procedure.                           After obtaining informed consent, the colonoscope                            was passed under direct vision. Throughout the                            procedure, the patient's blood pressure, pulse, and                            oxygen saturations were monitored continuously. The    Colonoscope was introduced through the anus and                            advanced to the the cecum, identified by                            appendiceal orifice and ileocecal valve. The                            ileocecal valve, appendiceal orifice, and rectum                            were photographed. The quality of the bowel                            preparation was excellent. The colonoscopy was                            performed without difficulty. The patient tolerated                            the procedure well. The bowel preparation used was                            SUPREP via split dose instruction. Scope In: 9:36:02 AM Scope Out: 9:52:10 AM Scope Withdrawal Time: 0 hours 10 minutes 44 seconds  Total Procedure Duration: 0 hours 16 minutes 8 seconds  Findings:                 Two polyps were found in the sigmoid colon and                            ascending colon. The polyps were 2 to 4 mm in size.                            These polyps were removed with a cold snare.                            Resection and retrieval were complete.  The exam was otherwise without abnormality on                            direct view. Rectal vault narrow, thus no                            retroflexion. However excellent view from the anal                            os. Complications:            No immediate complications. Estimated blood loss:                            None. Estimated Blood Loss:     Estimated blood loss: none. Impression:               - Two 2 to 4 mm polyps in the sigmoid colon and in                            the ascending colon, removed with a cold snare.                            Resected and retrieved.                           - The examination was otherwise normal. Recommendation:           - Repeat colonoscopy in 5 years for surveillance.                           - Patient has a contact number available for                             emergencies. The signs and symptoms of potential                            delayed complications were discussed with the                            patient. Return to normal activities tomorrow.                            Written discharge instructions were provided to the                            patient.                           - Resume previous diet.                           - Continue present medications.                           - Await pathology results. Docia Chuck. Henrene Pastor, MD 01/08/2020 9:57:08 AM This report  has been signed electronically.

## 2020-01-08 NOTE — Patient Instructions (Signed)
2 polyps found, removed and sent to pathology. Await pathology for final recommendations.  Handouts on findings given to patient.     YOU HAD AN ENDOSCOPIC PROCEDURE TODAY AT [facility]:   Refer to the procedure report that was given to you for any specific questions about what was found during the examination.  If the procedure report does not answer your questions, please call your gastroenterologist to clarify.  If you requested that your care partner not be given the details of your procedure findings, then the procedure report has been included in a sealed envelope for you to review at your convenience later.  YOU SHOULD EXPECT: Some feelings of bloating in the abdomen. Passage of more gas than usual.  Walking can help get rid of the air that was put into your GI tract during the procedure and reduce the bloating. If you had a lower endoscopy (such as a colonoscopy or flexible sigmoidoscopy) you may notice spotting of blood in your stool or on the toilet paper. If you underwent a bowel prep for your procedure, you may not have a normal bowel movement for a few days.  Please Note:  You might notice some irritation and congestion in your nose or some drainage.  This is from the oxygen used during your procedure.  There is no need for concern and it should clear up in a day or so.  SYMPTOMS TO REPORT IMMEDIATELY:   Following lower endoscopy (colonoscopy or flexible sigmoidoscopy):  Excessive amounts of blood in the stool  Significant tenderness or worsening of abdominal pains  Swelling of the abdomen that is new, acute  Fever of 100F or higher   For urgent or emergent issues, a gastroenterologist can be reached at any hour by calling [phone]. Do not use MyChart messaging for urgent concerns.    DIET:  We do recommend a small meal at first, but then you may proceed to your regular diet.  Drink plenty of fluids but you should avoid alcoholic beverages for 24  hours.  ACTIVITY:  You should plan to take it easy for the rest of today and you should NOT DRIVE or use heavy machinery until tomorrow (because of the sedation medicines used during the test).    FOLLOW UP: Our staff will call the number listed on your records 48-72 hours following your procedure to check on you and address any questions or concerns that you may have regarding the information given to you following your procedure. If we do not reach you, we will leave a message.  We will attempt to reach you two times.  During this call, we will ask if you have developed any symptoms of COVID 19. If you develop any symptoms (ie: fever, flu-like symptoms, shortness of breath, cough etc.) before then, please call [phone].  If you test positive for Covid 19 in the 2 weeks post procedure, please call and report this information to [place].    If any biopsies were taken you will be contacted by phone or by letter within the next 1-3 weeks.  Please call us at [phone] if you have not heard about the biopsies in 3 weeks.    SIGNATURES/CONFIDENTIALITY: You and/or your care partner have signed paperwork which will be entered into your electronic medical record.  These signatures attest to the fact that that the information above on your After Visit Summary has been reviewed and is understood.  Full responsibility of the confidentiality of this discharge information  lies with you and/or your care-partner.

## 2020-01-10 ENCOUNTER — Encounter: Payer: Self-pay | Admitting: Internal Medicine

## 2020-01-10 ENCOUNTER — Telehealth: Payer: Self-pay

## 2020-01-10 NOTE — Telephone Encounter (Signed)
  Follow up Call-  Call back number 01/08/2020  Post procedure Call Back phone  # [phone]  Permission to leave phone message Yes  Some recent data might be hidden     Patient questions:  Do you have a fever, pain , or abdominal swelling? No. Pain Score  0 *  Have you tolerated food without any problems? Yes.    Have you been able to return to your normal activities? Yes.    Do you have any questions about your discharge instructions: Diet   No. Medications  No. Follow up visit  No.  Do you have questions or concerns about your Care? No.  Actions: * If pain score is 4 or above: No action needed, pain <4.  1. Have you developed a fever since your procedure? no  2.   Have you had an respiratory symptoms (SOB or cough) since your procedure? no  3.   Have you tested positive for COVID 19 since your procedure no  4.   Have you had any family members/close contacts diagnosed with the COVID 19 since your procedure?  no   If yes to any of these questions please route to Joylene John, RN and Erenest Rasher, RN

## 2020-06-24 ENCOUNTER — Other Ambulatory Visit: Payer: Self-pay | Admitting: Internal Medicine

## 2020-06-24 DIAGNOSIS — Z1231 Encounter for screening mammogram for malignant neoplasm of breast: Secondary | ICD-10-CM

## 2020-07-23 ENCOUNTER — Other Ambulatory Visit: Payer: Self-pay | Admitting: Internal Medicine

## 2020-07-23 ENCOUNTER — Other Ambulatory Visit: Payer: Self-pay

## 2020-07-23 ENCOUNTER — Ambulatory Visit
Admission: RE | Admit: 2020-07-23 | Discharge: 2020-07-23 | Disposition: A | Discharge status: Home / Self Care | Payer: POS | Source: Ambulatory Visit | Attending: Internal Medicine | Admitting: Internal Medicine

## 2020-07-23 DIAGNOSIS — Z1231 Encounter for screening mammogram for malignant neoplasm of breast: Secondary | ICD-10-CM

## 2022-01-08 ENCOUNTER — Other Ambulatory Visit: Payer: Self-pay | Admitting: Internal Medicine

## 2022-01-08 DIAGNOSIS — Z1231 Encounter for screening mammogram for malignant neoplasm of breast: Secondary | ICD-10-CM

## 2022-01-12 ENCOUNTER — Ambulatory Visit: Payer: POS

## 2022-02-17 IMAGING — MG DIGITAL SCREENING BREAST BILAT IMPLANT W/ TOMO W/ CAD
8 of 12 series · 8 of 28 positions shown · non-contrast
Comparison: Previous exam(s).

CLINICAL DATA: Screening.

EXAM:
DIGITAL SCREENING BILATERAL MAMMOGRAM WITH IMPLANTS, CAD AND TOMO
The patient has retropectoral saline implants. Standard and implant
displaced views were performed.

[R CC]
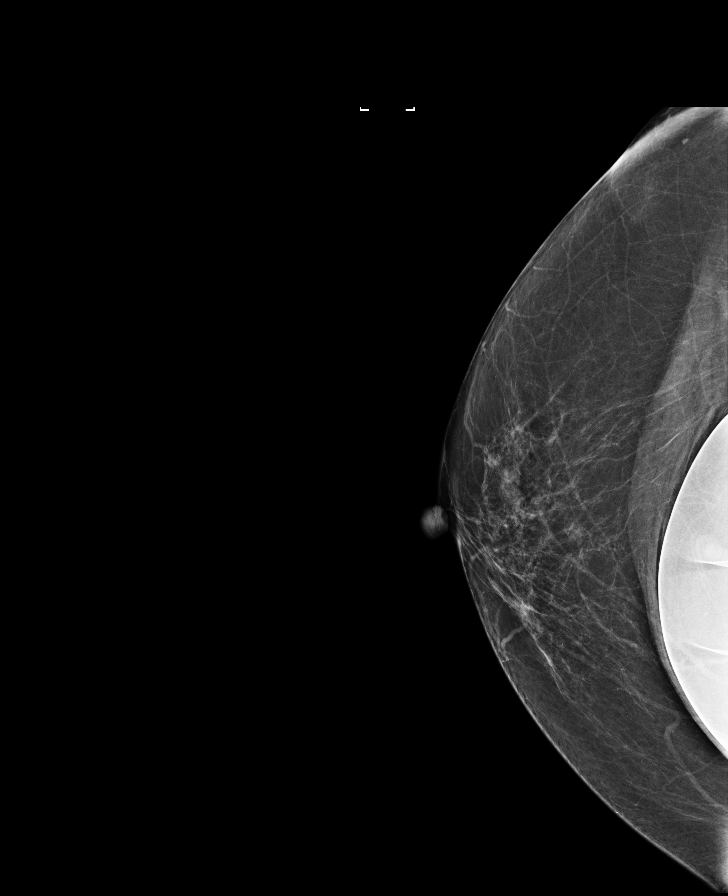

[R MLO]
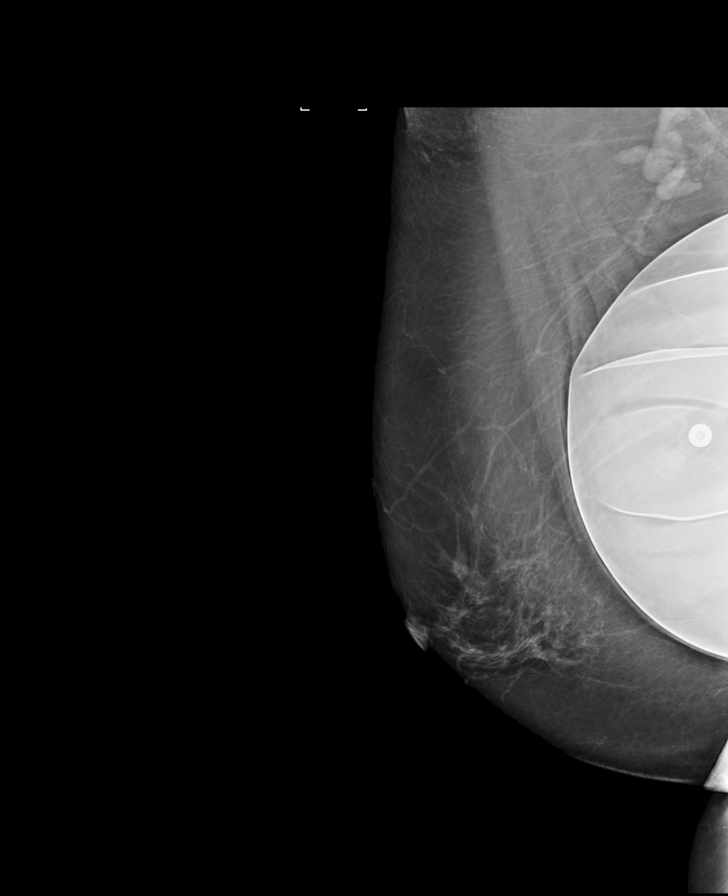

[L MLO]
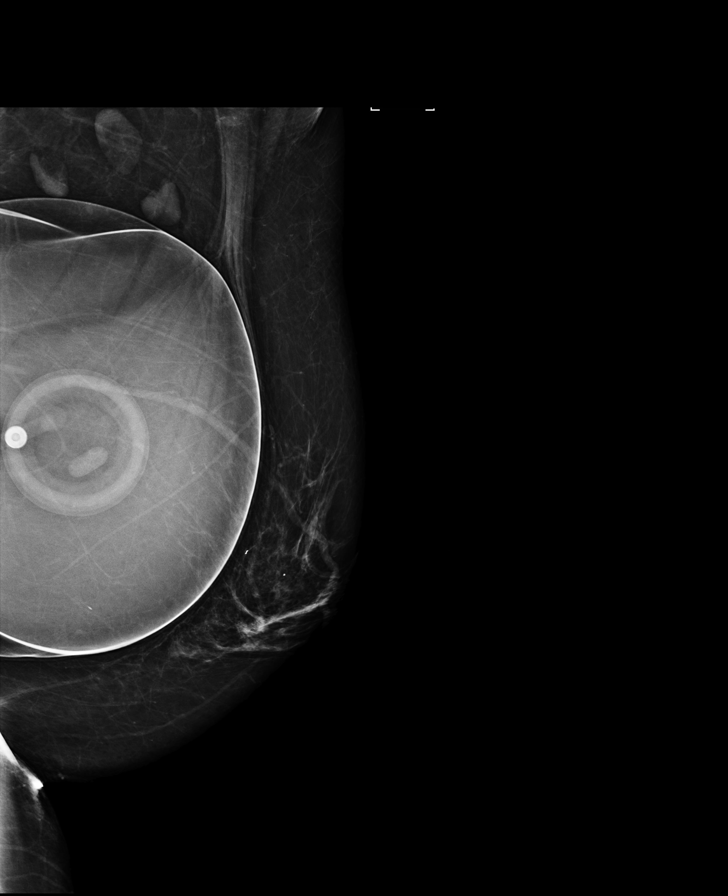

[L CC]
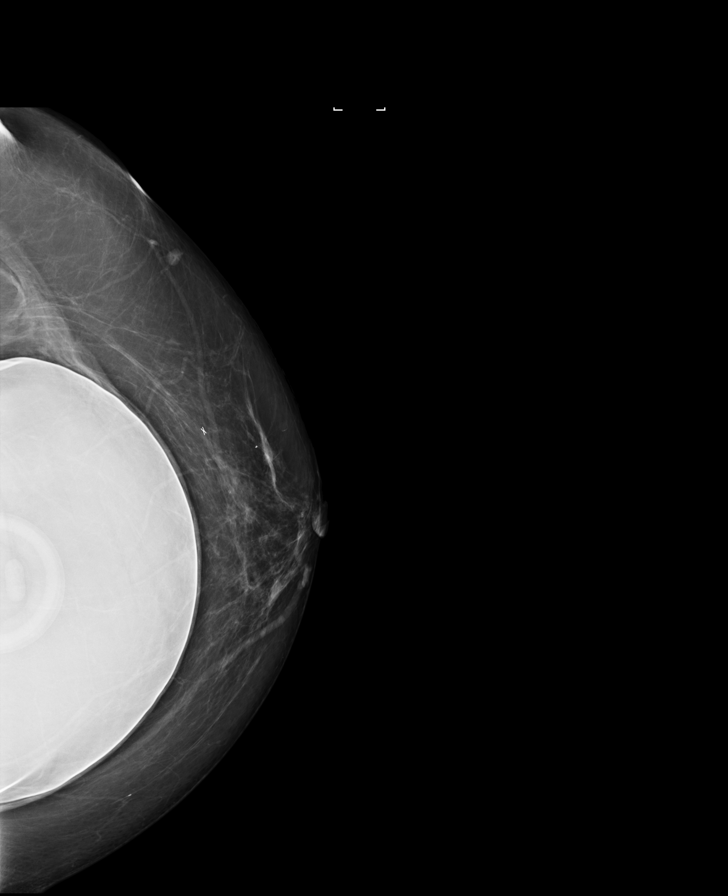

[L CC synth-2D]
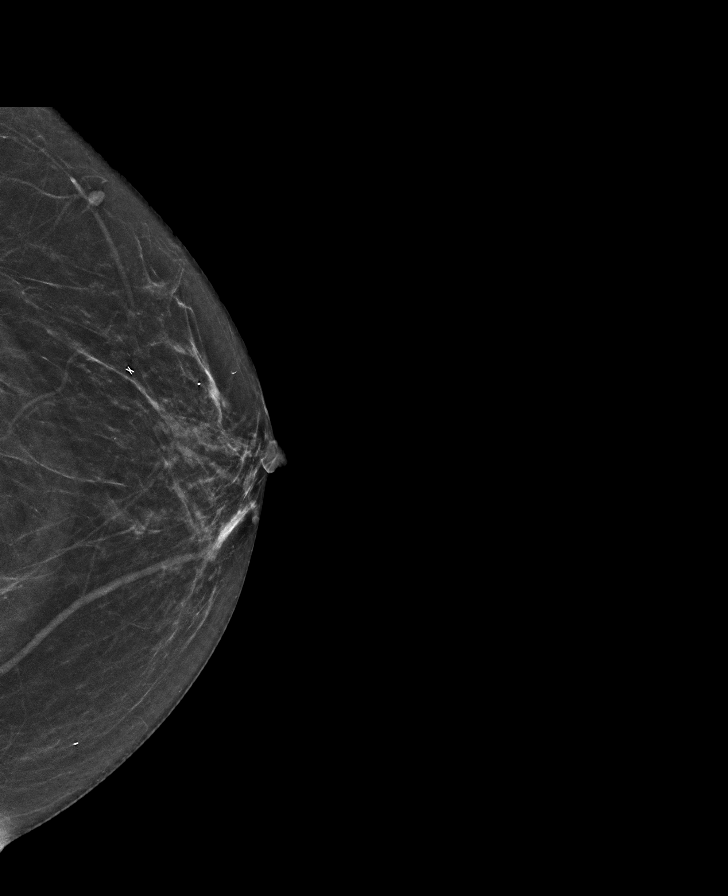

[L MLO synth-2D]
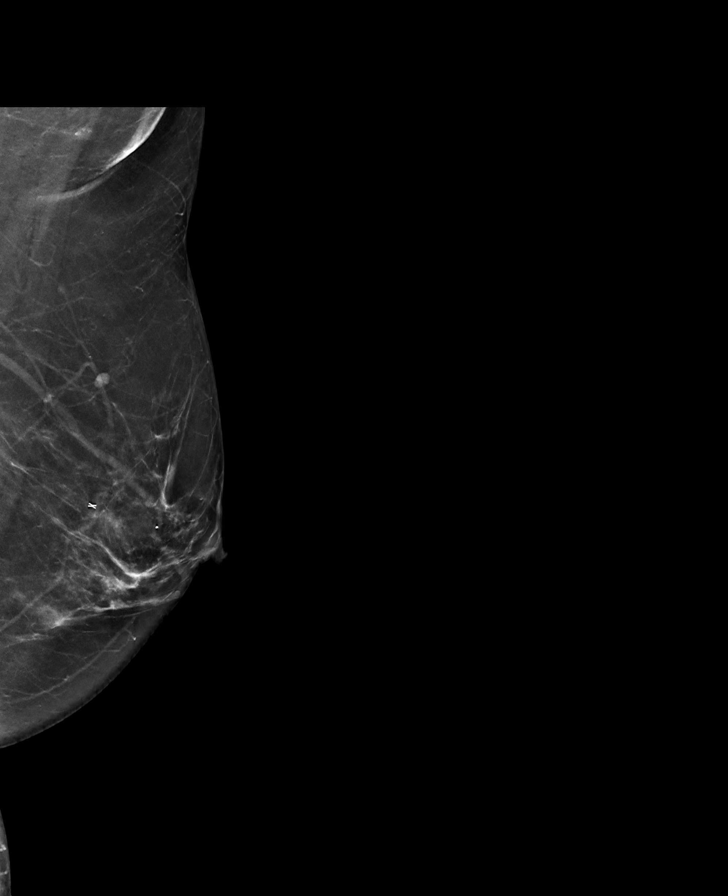

[R MLO synth-2D]
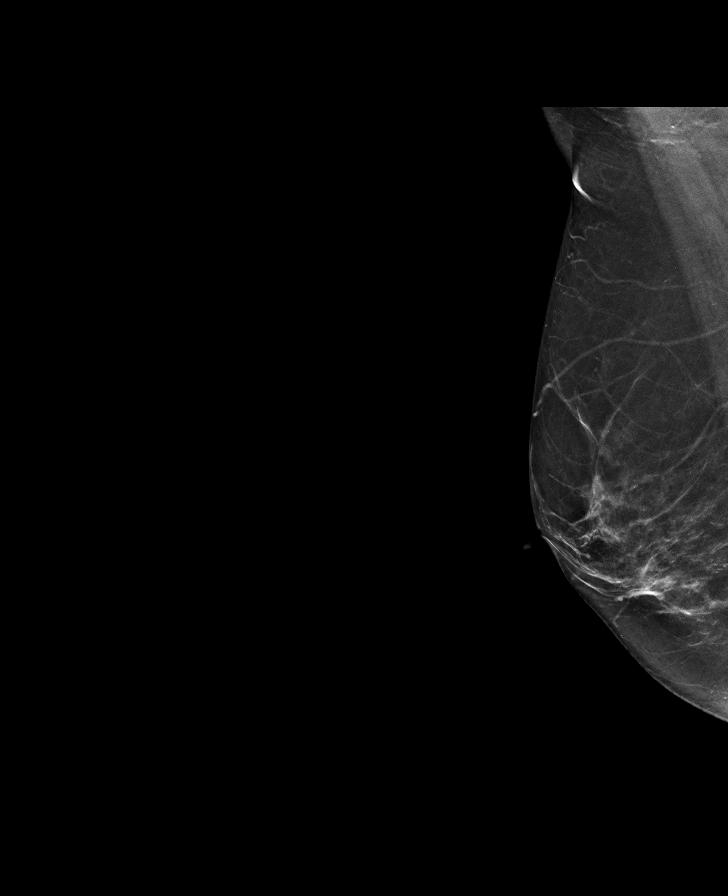

[R CC synth-2D]
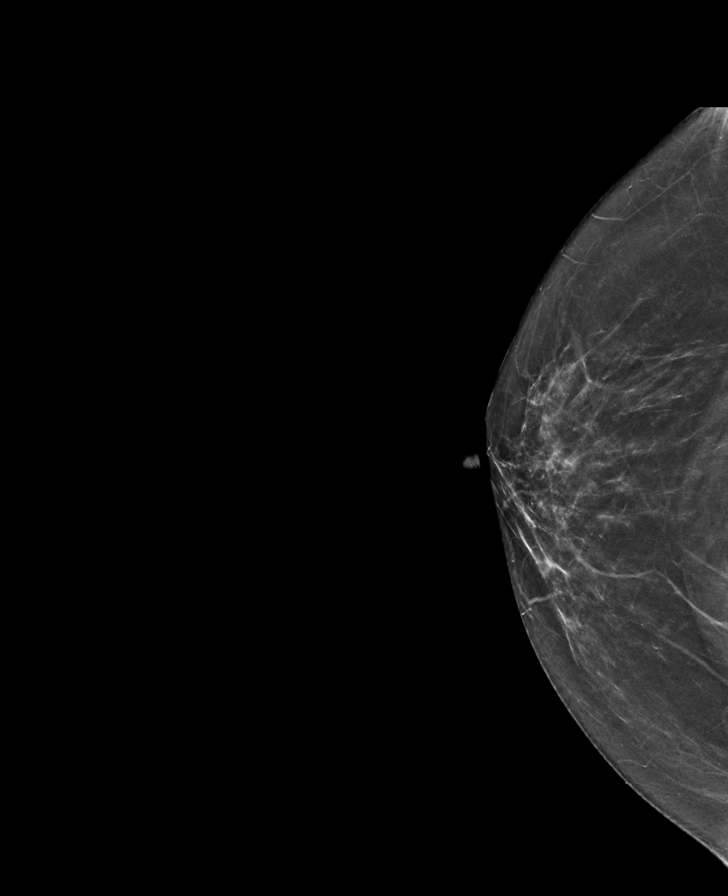

[8 of 28 positions shown; findings below may reference images not displayed]

ACR Breast Density Category b: There are scattered areas of
fibroglandular density.
FINDINGS: There are no findings suspicious for malignancy. Saline implants are
intact. Images were processed with CAD.
IMPRESSION: No mammographic evidence of malignancy. A result letter of this
screening mammogram will be mailed directly to the patient.

RECOMMENDATION:
Screening mammogram in one year. (Code:[id number])

BI-RADS CATEGORY  1:  Negative.

## 2022-03-04 ENCOUNTER — Ambulatory Visit: Payer: POS | Admitting: Cardiology

## 2022-03-04 ENCOUNTER — Encounter: Payer: Self-pay | Admitting: Cardiology

## 2022-03-04 VITALS — BP 130/80 | HR 93 | Temp 98.0°F | Resp 17 | Ht 62.0 in | Wt 196.0 lb

## 2022-03-04 DIAGNOSIS — I10 Essential (primary) hypertension: Secondary | ICD-10-CM

## 2022-03-04 DIAGNOSIS — R739 Hyperglycemia, unspecified: Secondary | ICD-10-CM

## 2022-03-04 DIAGNOSIS — E7800 Pure hypercholesterolemia, unspecified: Secondary | ICD-10-CM

## 2022-03-04 DIAGNOSIS — R9431 Abnormal electrocardiogram [ECG] [EKG]: Secondary | ICD-10-CM

## 2022-03-04 DIAGNOSIS — R0789 Other chest pain: Secondary | ICD-10-CM

## 2022-03-04 DIAGNOSIS — R0609 Other forms of dyspnea: Secondary | ICD-10-CM

## 2022-03-04 NOTE — Progress Notes (Signed)
Primary Physician/Referring:  Velna Hatchet, MD  Patient ID: Ann Rodgers, female    DOB: [birth year], 71 y.o.   MRN: [id number]  Chief Complaint  Patient presents with   Chest Pain   Hypertension   DOE   New Patient (Initial Visit)   HPI:    Celisa Schoenberg Wible  is a 71 y.o. African-American female patient with history of systemic lupus erythematosus followed by Dr. Gavin Pound, unfortunately lupus has remained stable over the past 5 to 6 years, presently not on any Biologics, primary hypertension, hyperglycemia, moderate obesity, hyperlipidemia was started on Crestor 2 days ago on 03/02/2022, was a non-smoker referred to me for evaluation of chest pain and dyspnea on exertion.  Chest pain episode that occurred 2-3 times over the past 1 week, retrosternal while she was physically active and when she sat down had chest discomfort.  No other associated symptoms.  She also has chronic dyspnea on exertion and attributes this to her weight.  No change in dyspnea over the past several years.  No leg edema, no PND or orthopnea.  Past Medical History:  Diagnosis Date   Anemia    Bell palsy    Fibromyalgia    GERD (gastroesophageal reflux disease)    Hypertension    Lupus (HCC)    Thyroid disease    Past Surgical History:  Procedure Laterality Date   AUGMENTATION MAMMAPLASTY Bilateral    BREAST BIOPSY Left    BREAST EXCISIONAL BIOPSY Right    benign   BREAST LUMPECTOMY     x2   COLONOSCOPY  2016   POLYPECTOMY     Family History  Problem Relation Age of Onset   Stroke Mother    Colon cancer Neg Hx    Colon polyps Neg Hx    Esophageal cancer Neg Hx    Rectal cancer Neg Hx    Stomach cancer Neg Hx     Social History   Tobacco Use   Smoking status: Former    Packs/day: 1.50    Years: 5.00    Total pack years: 7.50    Types: Cigarettes    Quit date: 08/06/2014    Years since quitting: 7.5   Smokeless tobacco: Never  Substance Use Topics   Alcohol use: No     Alcohol/week: 0.0 standard drinks of alcohol    Comment: quit 2010   Marital Status: Married  ROS  Review of Systems  Cardiovascular:  Positive for chest pain and dyspnea on exertion. Negative for leg swelling.   Objective  Blood pressure 130/80, pulse 93, temperature 98 F (36.7 C), resp. rate 17, height _0  (1.575 m), weight 196 lb (88.9 kg), SpO2 97 %. Body mass index is 35.85 kg/m.     03/04/2022    1:33 PM 03/04/2022   12:32 PM 01/08/2020   10:17 AM  Vitals with BMI  Height  _1    Weight  196 lbs   BMI  61.95   Systolic 093 267 124  Diastolic 80 78 74  Pulse  93 87    Physical Exam Constitutional:      Appearance: She is obese.  Neck:     Vascular: No JVD.  Cardiovascular:     Rate and Rhythm: Normal rate and regular rhythm.     Pulses: Intact distal pulses.     Heart sounds: Normal heart sounds. No murmur heard.    No gallop.  Pulmonary:     Effort: Pulmonary effort is normal.  Breath sounds: Normal breath sounds.  Abdominal:     General: Bowel sounds are normal.     Palpations: Abdomen is soft.  Musculoskeletal:     Right lower leg: No edema.     Left lower leg: No edema.     Medications and allergies   Allergies  Allergen Reactions   Codeine     "hot fever" and abdominal pain   Sulfa Antibiotics Nausea And Vomiting   Sulfamethoxazole-Trimethoprim Nausea Only and Nausea And Vomiting   Penicillins Rash    Has patient had a PCN reaction causing immediate rash, facial/tongue/throat swelling, SOB or lightheadedness with hypotension: NO Has patient had a PCN reaction causing severe rash involving mucus membranes or skin necrosis: NO Has patient had a PCN reaction that required hospitalization: NO Has patient had a PCN reaction occurring within the last 10 years: NO If all of the above answers are "NO", then may proceed with Cephalosporin use.     Medication list after today's encounter   Current Outpatient Medications:    ALPRAZolam (XANAX) 0.25  MG tablet, Take 0.25 mg by mouth 2 (two) times daily as needed., Disp: , Rfl:    amLODipine (NORVASC) 10 MG tablet, Take 10 mg by mouth at bedtime., Disp: , Rfl:    aspirin EC 81 MG tablet, Take 81 mg by mouth daily., Disp: , Rfl:    cyclobenzaprine (FLEXERIL) 5 MG tablet, Take 5 mg by mouth 3 (three) times daily as needed for muscle spasms., Disp: , Rfl: 0   escitalopram (LEXAPRO) 5 MG tablet, Take 5 mg by mouth daily., Disp: , Rfl:    gabapentin (NEURONTIN) 100 MG capsule, Take 200 mg by mouth at bedtime. , Disp: , Rfl:    ibuprofen (ADVIL,MOTRIN) 800 MG tablet, Take 800 mg by mouth every 8 (eight) hours as needed for mild pain., Disp: , Rfl:    losartan-hydrochlorothiazide (HYZAAR) 100-12.5 MG tablet, Take 1 tablet by mouth daily., Disp: , Rfl:    pantoprazole (PROTONIX) 40 MG tablet, Take 40 mg by mouth daily., Disp: , Rfl:    Potassium Chloride ER 20 MEQ TBCR, Take 1 tablet by mouth daily., Disp: , Rfl:    Propylene Glycol (SYSTANE COMPLETE OP), Place 1 drop into the left eye every 2 (two) hours as needed (dry eye)., Disp: , Rfl:    rosuvastatin (CRESTOR) 10 MG tablet, Take 10 mg by mouth daily., Disp: , Rfl:    SYNTHROID 75 MCG tablet, Take 75 mcg by mouth daily before breakfast. , Disp: , Rfl:   Laboratory examination:   External labs:   Labs 03/02/2022:  Serum glucose 95 mg, BUN 10, creatinine 0.8, EGFR 86 mL, potassium 3.8, LFTs normal.  Hb 11.7 HCT 35.7, platelets 326.  TSH normal at 4.02.  A1c 6.0%.  Total cholesterol 205, triglycerides 110, HDL 44, LDL 139, non-HDL cholesterol 161.  Radiology:    Cardiac Studies:   NA  EKG:   EKG 03/04/2022: Normal sinus rhythm at rate of 92 bpm, left atrial enlargement, normal axis.  Incomplete right bundle branch block.  Anteroseptal infarct old.  Low-voltage complexes.  Consider pulmonary disease pattern.    Assessment     ICD-10-CM   1. Atypical chest pain  R07.89 PCV ECHOCARDIOGRAM COMPLETE    PCV MYOCARDIAL PERFUSION WITH  LEXISCAN    2. Dyspnea on exertion  R06.09     3. Nonspecific abnormal electrocardiogram (ECG) (EKG)  R94.31 PCV ECHOCARDIOGRAM COMPLETE    PCV MYOCARDIAL PERFUSION WITH LEXISCAN  4. Essential hypertension, benign  I10 EKG 12-Lead    5. Hypercholesteremia  E78.00     6. Hyperglycemia  R73.9        Medications Discontinued During This Encounter  Medication Reason   Cholecalciferol (VITAMIN D3) 1.25 MG (50000 UT) CAPS    calcium carbonate (OSCAL) 1500 (600 Ca) MG TABS tablet    artificial tears (LACRILUBE) OINT ophthalmic ointment    0.9 %  sodium chloride infusion     No orders of the defined types were placed in this encounter.  Orders Placed This Encounter  Procedures   PCV MYOCARDIAL PERFUSION WITH LEXISCAN    Standing Status:   Future    Standing Expiration Date:   05/04/2022   EKG 12-Lead   PCV ECHOCARDIOGRAM COMPLETE    Standing Status:   Future    Standing Expiration Date:   03/05/2023   Recommendations:   Elner Seifert Okray is a 71 y.o. African-American female patient with history of systemic lupus erythematosus followed by Dr. Gavin Pound, unfortunately lupus has remained stable over the past 5 to 6 years, presently not on any Biologics, primary hypertension, hyperglycemia, moderate obesity, hyperlipidemia was started on Crestor 2 days ago on 03/02/2022, was a non-smoker referred to me for evaluation of chest pain and dyspnea on exertion.  Chest pain episode that occurred 2-3 times over the past 1 week, retrosternal while she was physically active and when she sat down had chest discomfort.  No other associated symptoms.  She also has chronic dyspnea on exertion and attributes this to her weight.  No change in dyspnea over the past several years.  No leg edema, no PND or orthopnea.  In view of her above risk factors especially carotid tissue disease, prediabetes mellitus, she is at high risk for cardiovascular events. Schedule for a Lexiscan Nuclear stress test to  evaluate for myocardial ischemia. 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 Will schedule for an echocardiogram.  Suspect her EKG abnormalities related to obesity however will do further evaluation of the same.  Blood pressure is well controlled, weight loss and obesity management discussed with the patient at length.  I would like to see her back after the test and make further recommendations.  Would not recommend coronary calcium score as she is willing to be on primary medical therapy with regard to primary prevention with statins and blood pressure is well controlled.  Weight loss will certainly help with hyperglycemia as well along with arthritis in her knee.  She has been diagnosed with hypovitaminosis D and was recommended vitamin D 5000 units daily, patient has discontinued this.  Advised her to restart the medication.    Adrian Prows, MD, [facility] 03/04/2022, 1:39 PM Office: [phone]

## 2022-03-10 ENCOUNTER — Ambulatory Visit
Admission: RE | Admit: 2022-03-10 | Discharge: 2022-03-10 | Disposition: A | Discharge status: Home / Self Care | Payer: POS | Source: Ambulatory Visit | Attending: Internal Medicine | Admitting: Internal Medicine

## 2022-03-10 DIAGNOSIS — Z1231 Encounter for screening mammogram for malignant neoplasm of breast: Secondary | ICD-10-CM

## 2022-03-15 ENCOUNTER — Ambulatory Visit: Payer: 59

## 2022-03-15 DIAGNOSIS — R0789 Other chest pain: Secondary | ICD-10-CM

## 2022-03-15 DIAGNOSIS — R9431 Abnormal electrocardiogram [ECG] [EKG]: Secondary | ICD-10-CM

## 2022-03-31 ENCOUNTER — Ambulatory Visit: Payer: 59

## 2022-03-31 DIAGNOSIS — R9431 Abnormal electrocardiogram [ECG] [EKG]: Secondary | ICD-10-CM

## 2022-03-31 DIAGNOSIS — R0789 Other chest pain: Secondary | ICD-10-CM

## 2022-04-07 NOTE — Progress Notes (Signed)
Normal echo.

## 2022-04-09 NOTE — Progress Notes (Signed)
Called patient, NA, LMMA

## 2022-04-14 NOTE — Progress Notes (Signed)
Called and spoke to patient she voiced understanding

## 2022-04-15 ENCOUNTER — Encounter: Payer: Self-pay | Admitting: Cardiology

## 2022-04-15 ENCOUNTER — Ambulatory Visit: Payer: 59 | Admitting: Cardiology

## 2022-04-15 VITALS — BP 107/69 | HR 85 | Resp 16 | Ht 62.0 in | Wt 194.0 lb

## 2022-04-15 DIAGNOSIS — R0789 Other chest pain: Secondary | ICD-10-CM

## 2022-04-15 DIAGNOSIS — I10 Essential (primary) hypertension: Secondary | ICD-10-CM

## 2022-04-15 DIAGNOSIS — R0609 Other forms of dyspnea: Secondary | ICD-10-CM

## 2022-04-15 DIAGNOSIS — R9431 Abnormal electrocardiogram [ECG] [EKG]: Secondary | ICD-10-CM

## 2022-04-15 NOTE — Progress Notes (Signed)
Primary Physician/Referring:  Alysia Penna, MD  Patient ID: Ann Rodgers, female    DOB: 1950-11-18, 71 y.o.   MRN: [id number]  Chief Complaint  Patient presents with   Chest Pain   Shortness of Breath   Follow-up   HPI:    Ann Rodgers  is a 71 y.o. African-American female patient with history of systemic lupus erythematosus followed by Dr. Zenovia Jordan, unfortunately lupus has remained stable over the past 5 to 6 years, presently not on any Biologics, primary hypertension, hyperglycemia, moderate obesity, hyperlipidemia was started on Crestor 2 days ago on 03/02/2022, was a non-smoker referred to me for evaluation of chest pain and dyspnea on exertion.  Patient presents for a 13-month office visit and follow-up of atypical chest pain, dyspnea on exertion and cardiac risk stratification.  She has made a trip to [place] recently and walked around a lot and has not had any further episodes of chest pain.  Past Medical History:  Diagnosis Date   Anemia    Bell palsy    Fibromyalgia    GERD (gastroesophageal reflux disease)    Hypertension    Lupus (HCC)    Thyroid disease    Past Surgical History:  Procedure Laterality Date   AUGMENTATION MAMMAPLASTY Bilateral    BREAST BIOPSY Left    BREAST EXCISIONAL BIOPSY Right    benign   BREAST LUMPECTOMY     x2   COLONOSCOPY  2016   POLYPECTOMY     Family History  Problem Relation Age of Onset   Stroke Mother    Colon cancer Neg Hx    Colon polyps Neg Hx    Esophageal cancer Neg Hx    Rectal cancer Neg Hx    Stomach cancer Neg Hx     Social History   Tobacco Use   Smoking status: Former    Packs/day: 1.50    Years: 5.00    Total pack years: 7.50    Types: Cigarettes    Quit date: 08/06/2014    Years since quitting: 7.6   Smokeless tobacco: Never  Substance Use Topics   Alcohol use: No    Alcohol/week: 0.0 standard drinks of alcohol    Comment: quit 2010   Marital Status: Married  ROS  Review of  Systems  Cardiovascular:  Positive for chest pain and dyspnea on exertion. Negative for leg swelling.   Objective  Blood pressure 107/69, pulse 85, resp. rate 16, height 5\' 2"  (1.575 m), weight 194 lb (88 kg), SpO2 97 %. Body mass index is 35.48 kg/m.     04/15/2022    2:00 PM 03/04/2022    1:33 PM 03/04/2022   12:32 PM  Vitals with BMI  Height 5\' 2"   5\' 2"   Weight 194 lbs  196 lbs  BMI 35.47  35.84  Systolic 107 130 03/06/2022  Diastolic 69 80 78  Pulse 85  93    Physical Exam Constitutional:      Appearance: She is obese.  Neck:     Vascular: No JVD.  Cardiovascular:     Rate and Rhythm: Normal rate and regular rhythm.     Pulses: Intact distal pulses.     Heart sounds: Normal heart sounds. No murmur heard.    No gallop.  Pulmonary:     Effort: Pulmonary effort is normal.     Breath sounds: Normal breath sounds.  Abdominal:     General: Bowel sounds are normal.     Palpations:  Abdomen is soft.  Musculoskeletal:     Right lower leg: No edema.     Left lower leg: No edema.     Medications and allergies   Allergies  Allergen Reactions   Codeine     "hot fever" and abdominal pain   Sulfa Antibiotics Nausea And Vomiting   Sulfamethoxazole-Trimethoprim Nausea Only and Nausea And Vomiting   Penicillins Rash    Has patient had a PCN reaction causing immediate rash, facial/tongue/throat swelling, SOB or lightheadedness with hypotension: NO Has patient had a PCN reaction causing severe rash involving mucus membranes or skin necrosis: NO Has patient had a PCN reaction that required hospitalization: NO Has patient had a PCN reaction occurring within the last 10 years: NO If all of the above answers are "NO", then may proceed with Cephalosporin use.     Medication list after today's encounter   Current Outpatient Medications:    ALPRAZolam (XANAX) 0.25 MG tablet, Take 0.25 mg by mouth 2 (two) times daily as needed., Disp: , Rfl:    amLODipine (NORVASC) 10 MG tablet, Take 10 mg  by mouth at bedtime., Disp: , Rfl:    aspirin EC 81 MG tablet, Take 81 mg by mouth daily., Disp: , Rfl:    Cholecalciferol (VITAMIN D3) 75 MCG (3000 UT) TABS, Take by mouth., Disp: , Rfl:    cyclobenzaprine (FLEXERIL) 5 MG tablet, Take 5 mg by mouth 3 (three) times daily as needed for muscle spasms., Disp: , Rfl: 0   escitalopram (LEXAPRO) 5 MG tablet, Take 5 mg by mouth daily., Disp: , Rfl:    gabapentin (NEURONTIN) 100 MG capsule, Take 200 mg by mouth at bedtime. , Disp: , Rfl:    ibuprofen (ADVIL,MOTRIN) 800 MG tablet, Take 800 mg by mouth every 8 (eight) hours as needed for mild pain., Disp: , Rfl:    losartan-hydrochlorothiazide (HYZAAR) 100-12.5 MG tablet, Take 1 tablet by mouth daily., Disp: , Rfl:    pantoprazole (PROTONIX) 40 MG tablet, Take 40 mg by mouth daily., Disp: , Rfl:    Potassium Chloride ER 20 MEQ TBCR, Take 1 tablet by mouth daily., Disp: , Rfl:    Propylene Glycol (SYSTANE COMPLETE OP), Place 1 drop into the left eye every 2 (two) hours as needed (dry eye)., Disp: , Rfl:    rosuvastatin (CRESTOR) 10 MG tablet, Take 10 mg by mouth daily., Disp: , Rfl:    SYNTHROID 75 MCG tablet, Take 75 mcg by mouth daily before breakfast. , Disp: , Rfl:   Laboratory examination:   External labs:   Labs 03/02/2022:  Serum glucose 95 mg, BUN 10, creatinine 0.8, EGFR 86 mL, potassium 3.8, LFTs normal.  Hb 11.7 HCT 35.7, platelets 326.  TSH normal at 4.02.  A1c 6.0%.  Total cholesterol 205, triglycerides 110, HDL 44, LDL 139, non-HDL cholesterol 161.  Radiology:    Cardiac Studies:   PCV ECHOCARDIOGRAM COMPLETE 03/31/2022  Narrative Echocardiogram 3/90/3009 Normal LV systolic function with visual EF 55-60%. Left ventricle cavity is normal in size. Mild concentric hypertrophy of the left ventricle. Normal global wall motion. Calculated EF 55%.    PCV MYOCARDIAL PERFUSION WITH LEXISCAN 03/15/2022  Narrative Exercise nuclear stress test 03/15/2022: Normal ECG stress. The  patient exercised for 5 minutes and 3 seconds of a Bruce protocol, achieving approximately 4.64 METs and 88% of MPHR. There were frequent PVC at peak exercise and 1 V- couplet. The blood pressure response was normal. Myocardial perfusion is normal. Overall LV systolic function is normal  without regional wall motion abnormalities. Calculated Stress LV EF: 45% but visually appears normal. Small volume LV. No previous exam available for comparison. Low risk study.    EKG:   EKG 03/04/2022: Normal sinus rhythm at rate of 92 bpm, left atrial enlargement, normal axis.  Incomplete right bundle branch block.  Anteroseptal infarct old.  Low-voltage complexes.  Consider pulmonary disease pattern.    Assessment     ICD-10-CM   1. Atypical chest pain  R07.89     2. Dyspnea on exertion  R06.09     3. Essential hypertension, benign  I10     4. Nonspecific abnormal electrocardiogram (ECG) (EKG)  R94.31        There are no discontinued medications.   No orders of the defined types were placed in this encounter.  No orders of the defined types were placed in this encounter.  Recommendations:   Ann Rodgers is a 71 y.o. African-American female patient with history of systemic lupus erythematosus followed by Dr. Gavin Pound, unfortunately lupus has remained stable over the past 5 to 6 years, presently not on any Biologics, primary hypertension, hyperglycemia, moderate obesity, hyperlipidemia was started on Crestor 2 days ago on 03/02/2022, was a non-smoker referred to me for evaluation of chest pain and dyspnea on exertion.  Patient presents for a 36-monthoffice visit and follow-up of atypical chest pain, dyspnea on exertion and cardiac risk stratification.  [place] and has not had any further episodes of chest pain.  Dyspnea is chronic and has remained stable without PND or orthopnea.  No leg edema.  Reviewed her echocardiogram and  nuclear stress test, overall low risk nuclear stress test and echocardiogram is essentially normal.  Suspect chest pain is probably noncardiac in etiology.  Advised her to continue to lose weight.  Blood pressure is well-controlled.  She is on appropriate medical therapy.  In view of connective tissue disease, prediabetes mellitus and hypertension, she would still be at moderate if not high risk for future cardiovascular events, patient has agreed to be on statins which she is tolerating without myalgias although she does have fibromyalgia and lupus.  I would recommend that if LDL is not at goal that is <100 and possibly closer to 70, would recommend addition of Zetia instead of increasing the dose of rosuvastatin.  Otherwise stable from cardiac standpoint, I will see him back on a as needed basis     JAdrian Prows MD, [facility]8/06/2022, 3:00 PM Office: [phone]

## 2022-10-28 ENCOUNTER — Other Ambulatory Visit (HOSPITAL_BASED_OUTPATIENT_CLINIC_OR_DEPARTMENT_OTHER): Payer: Self-pay | Admitting: Gastroenterology

## 2022-10-28 DIAGNOSIS — R1033 Periumbilical pain: Secondary | ICD-10-CM

## 2022-10-30 ENCOUNTER — Ambulatory Visit (HOSPITAL_BASED_OUTPATIENT_CLINIC_OR_DEPARTMENT_OTHER)
Admission: RE | Admit: 2022-10-30 | Discharge: 2022-10-30 | Disposition: A | Discharge status: Home / Self Care | Payer: 59 | Source: Ambulatory Visit | Attending: Gastroenterology | Admitting: Gastroenterology

## 2022-10-30 DIAGNOSIS — R1033 Periumbilical pain: Secondary | ICD-10-CM | POA: Diagnosis not present

## 2022-10-30 MED ORDER — IOHEXOL 300 MG/ML  SOLN
100.0000 mL | Freq: Once | INTRAMUSCULAR | Status: AC | PRN
  Administered 2022-10-30: 100 mL via INTRAVENOUS

## 2023-02-11 ENCOUNTER — Other Ambulatory Visit: Payer: Self-pay | Admitting: Obstetrics and Gynecology

## 2023-03-09 ENCOUNTER — Other Ambulatory Visit: Payer: Self-pay | Admitting: Obstetrics and Gynecology

## 2023-05-16 ENCOUNTER — Inpatient Hospital Stay (HOSPITAL_COMMUNITY): Admission: RE | Admit: 2023-05-16 | Payer: POS | Source: Ambulatory Visit

## 2023-05-23 ENCOUNTER — Inpatient Hospital Stay: Admit: 2023-05-23 | Payer: POS | Admitting: Obstetrics and Gynecology

## 2023-05-23 SURGERY — HYSTERECTOMY, VAGINAL, LAPAROSCOPY-ASSISTED, WITH SALPINGECTOMY
Anesthesia: General | Laterality: Bilateral

## 2023-07-18 ENCOUNTER — Other Ambulatory Visit: Payer: Self-pay | Admitting: Obstetrics and Gynecology

## 2023-07-18 NOTE — Progress Notes (Signed)
Surgical Instructions   Your procedure is scheduled on Monday July 25, 2023. Report to [facility] Main Entrance "A" at 7:30 A.M., then check in with the Admitting office. Any questions or running late day of surgery: call [phone]  Questions prior to your surgery date: call [phone], Monday-Friday, 8am-4pm. If you experience any cold or flu symptoms such as cough, fever, chills, shortness of breath, etc. between now and your scheduled surgery, please notify us at the above number.     Remember:  Do not eat after midnight the night before your surgery   You may drink clear liquids until 6:30 the morning of your surgery.   Clear liquids allowed are: Water, Non-Citrus Juices (without pulp), Carbonated Beverages, Clear Tea (no milk, honey, etc.), Black Coffee Only (NO MILK, CREAM OR POWDERED CREAMER of any kind), and Gatorade.    Take these medicines the morning of surgery with A SIP OF WATER  escitalopram (LEXAPRO)  pantoprazole (PROTONIX)  rosuvastatin (CRESTOR)  SYNTHROID  traMADol Janean Sark)   May take these medicines IF NEEDED: acetaminophen (TYLENOL)  cyclobenzaprine (FLEXERIL)  fluticasone (FLONASE) gabapentin (NEURONTIN)  TraMADol (ULTRAM)   One week prior to surgery, STOP taking any Aspirin (unless otherwise instructed by your surgeon) Aleve, Naproxen, Ibuprofen, Motrin, Advil, Goody's, BC's, all herbal medications, fish oil, and non-prescription vitamins.                     Do NOT Smoke (Tobacco/Vaping) for 24 hours prior to your procedure.  If you use a CPAP at night, you may bring your mask/headgear for your overnight stay.   You will be asked to remove any contacts, glasses, piercing's, hearing aid's, dentures/partials prior to surgery. Please bring cases for these items if needed.    Patients discharged the day of surgery will not be allowed to drive home, and someone needs to stay with them for 24 hours.  SURGICAL WAITING ROOM VISITATION Patients may  have no more than 2 support people in the waiting area - these visitors may rotate.   Pre-op nurse will coordinate an appropriate time for 1 ADULT support person, who may not rotate, to accompany patient in pre-op.  Children under the age of 98 must have an adult with them who is not the patient and must remain in the main waiting area with an adult.  If the patient needs to stay at the hospital during part of their recovery, the visitor guidelines for inpatient rooms apply.  Please refer to the [facility] website for the visitor guidelines for any additional information.   If you received a COVID test during your pre-op visit  it is requested that you wear a mask when out in public, stay away from anyone that may not be feeling well and notify your surgeon if you develop symptoms. If you have been in contact with anyone that has tested positive in the last 10 days please notify you surgeon.      Pre-operative CHG Bathing Instructions   You can play a key role in reducing the risk of infection after surgery. Your skin needs to be as free of germs as possible. You can reduce the number of germs on your skin by washing with CHG (chlorhexidine gluconate) soap before surgery. CHG is an antiseptic soap that kills germs and continues to kill germs even after washing.   DO NOT use if you have an allergy to chlorhexidine/CHG or antibacterial soaps. If your skin becomes reddened or irritated, stop using the  CHG and notify one of our RNs at [phone].              TAKE A SHOWER THE NIGHT BEFORE SURGERY AND THE DAY OF SURGERY    Please keep in mind the following:  DO NOT shave, including legs and underarms, 48 hours prior to surgery.   You may shave your face before/day of surgery.  Place clean sheets on your bed the night before surgery Use a clean washcloth (not used since being washed) for each shower. DO NOT sleep with pet's night before surgery.  CHG Shower Instructions:  Wash your face  and private area with normal soap. If you choose to wash your hair, wash first with your normal shampoo.  After you use shampoo/soap, rinse your hair and body thoroughly to remove shampoo/soap residue.  Turn the water OFF and apply half the bottle of CHG soap to a CLEAN washcloth.  Apply CHG soap ONLY FROM YOUR NECK DOWN TO YOUR TOES (washing for 3-5 minutes)  DO NOT use CHG soap on face, private areas, open wounds, or sores.  Pay special attention to the area where your surgery is being performed.  If you are having back surgery, having someone wash your back for you may be helpful. Wait 2 minutes after CHG soap is applied, then you may rinse off the CHG soap.  Pat dry with a clean towel  Put on clean pajamas    Additional instructions for the day of surgery: DO NOT APPLY any lotions, deodorants or perfumes.   Do not wear jewelry or makeup Do not wear nail polish, gel polish, artificial nails, or any other type of covering on natural nails (fingers and toes) Do not bring valuables to the hospital. [facility] is not responsible for valuables/personal belongings. Put on clean/comfortable clothes.  Please brush your teeth.  Ask your nurse before applying any prescription medications to the skin.

## 2023-07-19 ENCOUNTER — Other Ambulatory Visit: Payer: Self-pay

## 2023-07-19 ENCOUNTER — Inpatient Hospital Stay (HOSPITAL_COMMUNITY)
Admission: RE | Admit: 2023-07-19 | Discharge: 2023-07-19 | Disposition: A | Discharge status: Home / Self Care | Payer: 59 | Source: Ambulatory Visit | Attending: Obstetrics and Gynecology

## 2023-07-19 ENCOUNTER — Encounter (HOSPITAL_COMMUNITY): Payer: Self-pay

## 2023-07-19 VITALS — BP 110/62 | HR 89 | Temp 97.5°F | Resp 17 | Ht 62.0 in | Wt 207.9 lb

## 2023-07-19 DIAGNOSIS — Z01818 Encounter for other preprocedural examination: Secondary | ICD-10-CM | POA: Diagnosis present

## 2023-07-19 LAB — TYPE AND SCREEN
ABO/RH(D): O POS
Antibody Screen: NEGATIVE

## 2023-07-19 LAB — CBC
HCT: 35.8 % — ABNORMAL LOW (ref 36.0–46.0)
Hemoglobin: 11.2 g/dL — ABNORMAL LOW (ref 12.0–15.0)
MCH: 28 pg (ref 26.0–34.0)
MCHC: 31.3 g/dL (ref 30.0–36.0)
MCV: 89.5 fL (ref 80.0–100.0)
Platelets: 341 10*3/uL (ref 150–400)
RBC: 4 MIL/uL (ref 3.87–5.11)
RDW: 13.2 % (ref 11.5–15.5)
WBC: 5.4 10*3/uL (ref 4.0–10.5)
nRBC: 0 % (ref 0.0–0.2)

## 2023-07-19 LAB — BASIC METABOLIC PANEL
Anion gap: 13 (ref 5–15)
BUN: 6 mg/dL — ABNORMAL LOW (ref 8–23)
CO2: 22 mmol/L (ref 22–32)
Calcium: 9.4 mg/dL (ref 8.9–10.3)
Chloride: 101 mmol/L (ref 98–111)
Creatinine, Ser: 0.97 mg/dL (ref 0.44–1.00)
GFR, Estimated: >60 mL/min (ref >60)
Glucose, Bld: 123 mg/dL — ABNORMAL HIGH (ref 70–99)
Potassium: 3.3 mmol/L — ABNORMAL LOW (ref 3.5–5.1)
Sodium: 136 mmol/L (ref 135–145)

## 2023-07-19 LAB — SURGICAL PCR SCREEN
MRSA, PCR: NEGATIVE
Staphylococcus aureus: NEGATIVE

## 2023-07-19 NOTE — Progress Notes (Signed)
PCP - Dr. Alysia Penna Cardiologist -   PPM/ICD -  Device Orders -  Rep Notified -   Chest x-ray - na  EKG - PAT, 07/19/2023 Stress Test -  ECHO - 03/31/2022 Cardiac Cath -   Sleep Study -  CPAP -   Non-diabetic  Blood Thinner Instructions: Aspirin Instructions:  ERAS Protcol - Clear fluids until 0630  COVID TEST- na  Anesthesia review: No   Patient denies shortness of breath, fever, cough and chest pain at PAT appointment   All instructions explained to the patient, with a verbal understanding of the material. Patient agrees to go over the instructions while at home for a better understanding. Patient also instructed to self quarantine after being tested for COVID-19. The opportunity to ask questions was provided.

## 2023-07-24 NOTE — Anesthesia Preprocedure Evaluation (Signed)
Anesthesia Evaluation  Patient identified by MRN, date of birth, ID band Patient awake    Reviewed: Allergy & Precautions, NPO status , Patient's Chart, lab work & pertinent test results  Airway Mallampati: III  TM Distance: >3 FB Neck ROM: Full    Dental  (+) Edentulous Upper, Edentulous Lower, Dental Advisory Given   Pulmonary former smoker   Pulmonary exam normal breath sounds clear to auscultation       Cardiovascular hypertension, Pt. on medications Normal cardiovascular exam Rhythm:Regular Rate:Normal  Echocardiogram 03/01/2022 Normal LV systolic function with visual EF 55-60%. Left ventricle cavity is normal in size. Mild concentric hypertrophy of the left ventricle. Normal global wall motion. Calculated EF 55%.   Exercise nuclear stress test 03/15/2022: Normal ECG stress. The patient exercised for 5 minutes and 3 seconds of a Bruce protocol, achieving approximately 4.64 METs and 88% of MPHR. There were frequent PVC at peak exercise and 1 V- couplet. The blood pressure response was normal. Myocardial perfusion is normal. Overall LV systolic function is normal without regional wall motion abnormalities. Calculated Stress LV EF: 45% but visually appears normal. Small volume LV.  No previous exam available for comparison. Low risk study.     Neuro/Psych  PSYCHIATRIC DISORDERS Anxiety     negative neurological ROS     GI/Hepatic Neg liver ROS,GERD  Medicated and Controlled,,  Endo/Other    Class 3 obesity  Renal/GU negative Renal ROS     Musculoskeletal  (+)  Fibromyalgia -  Abdominal  (+) + obese  Peds  Hematology  (+) Blood dyscrasia, anemia   Anesthesia Other Findings   Reproductive/Obstetrics                             Anesthesia Physical Anesthesia Plan  ASA: 3  Anesthesia Plan: General   Post-op Pain Management: Gabapentin PO (pre-op)* and Ofirmev IV (intra-op)*    Induction: Intravenous  PONV Risk Score and Plan: 4 or greater and Ondansetron, Dexamethasone, Treatment may vary due to age or medical condition and Propofol infusion  Airway Management Planned: Oral ETT  Additional Equipment:   Intra-op Plan:   Post-operative Plan: Extubation in OR  Informed Consent: I have reviewed the patients History and Physical, chart, labs and discussed the procedure including the risks, benefits and alternatives for the proposed anesthesia with the patient or authorized representative who has indicated his/her understanding and acceptance.     Dental advisory given  Plan Discussed with: CRNA  Anesthesia Plan Comments:         Anesthesia Quick Evaluation

## 2023-07-25 ENCOUNTER — Ambulatory Visit (HOSPITAL_BASED_OUTPATIENT_CLINIC_OR_DEPARTMENT_OTHER): Payer: Self-pay | Admitting: Anesthesiology

## 2023-07-25 ENCOUNTER — Other Ambulatory Visit: Payer: Self-pay

## 2023-07-25 ENCOUNTER — Encounter (HOSPITAL_COMMUNITY)
Admission: RE | Disposition: A | Discharge status: Home / Self Care | Payer: Self-pay | Source: Home / Self Care | Attending: Obstetrics and Gynecology

## 2023-07-25 ENCOUNTER — Observation Stay (HOSPITAL_COMMUNITY)
Admission: RE | Admit: 2023-07-25 | Discharge: 2023-07-26 | Disposition: A | Discharge status: Home / Self Care | Payer: 59 | Attending: Obstetrics and Gynecology | Admitting: Obstetrics and Gynecology

## 2023-07-25 ENCOUNTER — Ambulatory Visit (HOSPITAL_COMMUNITY): Payer: Self-pay | Admitting: Anesthesiology

## 2023-07-25 ENCOUNTER — Encounter (HOSPITAL_COMMUNITY): Payer: Self-pay | Admitting: Obstetrics and Gynecology

## 2023-07-25 DIAGNOSIS — Z90710 Acquired absence of both cervix and uterus: Principal | ICD-10-CM | POA: Diagnosis present

## 2023-07-25 DIAGNOSIS — Z01818 Encounter for other preprocedural examination: Secondary | ICD-10-CM

## 2023-07-25 DIAGNOSIS — Z87891 Personal history of nicotine dependence: Secondary | ICD-10-CM | POA: Diagnosis not present

## 2023-07-25 DIAGNOSIS — N950 Postmenopausal bleeding: Secondary | ICD-10-CM | POA: Diagnosis not present

## 2023-07-25 DIAGNOSIS — Z7982 Long term (current) use of aspirin: Secondary | ICD-10-CM | POA: Diagnosis not present

## 2023-07-25 DIAGNOSIS — I10 Essential (primary) hypertension: Secondary | ICD-10-CM | POA: Insufficient documentation

## 2023-07-25 DIAGNOSIS — N920 Excessive and frequent menstruation with regular cycle: Principal | ICD-10-CM | POA: Insufficient documentation

## 2023-07-25 DIAGNOSIS — Z79899 Other long term (current) drug therapy: Secondary | ICD-10-CM | POA: Insufficient documentation

## 2023-07-25 HISTORY — PX: LAPAROSCOPIC VAGINAL HYSTERECTOMY WITH SALPINGECTOMY: SHX6680

## 2023-07-25 HISTORY — PX: CYSTOSCOPY: SHX5120

## 2023-07-25 LAB — ABO/RH: ABO/RH(D): O POS

## 2023-07-25 SURGERY — HYSTERECTOMY, VAGINAL, LAPAROSCOPY-ASSISTED, WITH SALPINGECTOMY
Anesthesia: General

## 2023-07-25 MED ORDER — DROPERIDOL 2.5 MG/ML IJ SOLN: 0.6250 mg | Freq: Once | INTRAMUSCULAR | Status: DC | PRN

## 2023-07-25 MED ORDER — ROCURONIUM BROMIDE 10 MG/ML (PF) SYRINGE
PREFILLED_SYRINGE | INTRAVENOUS | Status: DC | PRN
  Administered 2023-07-25: 10 mg via INTRAVENOUS
  Administered 2023-07-25: 60 mg via INTRAVENOUS
  Administered 2023-07-25: 10 mg via INTRAVENOUS

## 2023-07-25 MED ORDER — EPHEDRINE SULFATE-NACL 50-0.9 MG/10ML-% IV SOSY
PREFILLED_SYRINGE | INTRAVENOUS | Status: DC | PRN
  Administered 2023-07-25 (×3): 5 mg via INTRAVENOUS

## 2023-07-25 MED ORDER — TRAMADOL HCL 50 MG PO TABS: 50.0000 mg | ORAL_TABLET | Freq: Four times a day (QID) | ORAL | Status: DC | PRN

## 2023-07-25 MED ORDER — PROPOFOL 10 MG/ML IV BOLUS
INTRAVENOUS | Status: AC
  Filled 2023-07-25: qty 20

## 2023-07-25 MED ORDER — DIPHENHYDRAMINE HCL 12.5 MG/5ML PO ELIX
12.5000 mg | ORAL_SOLUTION | Freq: Four times a day (QID) | ORAL | Status: DC | PRN
  Filled 2023-07-25: qty 5

## 2023-07-25 MED ORDER — ONDANSETRON HCL 4 MG PO TABS: 4.0000 mg | ORAL_TABLET | Freq: Four times a day (QID) | ORAL | Status: DC | PRN

## 2023-07-25 MED ORDER — DIPHENHYDRAMINE HCL 50 MG/ML IJ SOLN
12.5000 mg | Freq: Four times a day (QID) | INTRAMUSCULAR | Status: DC | PRN
  Administered 2023-07-25 – 2023-07-26 (×2): 12.5 mg via INTRAVENOUS
  Filled 2023-07-25 (×2): qty 1

## 2023-07-25 MED ORDER — HYDROMORPHONE 1 MG/ML IV SOLN
INTRAVENOUS | Status: DC
Start: 2023-07-25 — End: 2023-07-26
  Administered 2023-07-25 – 2023-07-26 (×3): 30 mg via INTRAVENOUS
  Administered 2023-07-26: 0.8 mg via INTRAVENOUS
  Administered 2023-07-26: 30 mg via INTRAVENOUS
  Administered 2023-07-26: 0.2 mg via INTRAVENOUS

## 2023-07-25 MED ORDER — ALBUMIN HUMAN 5 % IV SOLN: INTRAVENOUS | Status: DC | PRN

## 2023-07-25 MED ORDER — SIMETHICONE 80 MG PO CHEW: 80.0000 mg | CHEWABLE_TABLET | Freq: Four times a day (QID) | ORAL | Status: DC | PRN

## 2023-07-25 MED ORDER — 0.9 % SODIUM CHLORIDE (POUR BTL) OPTIME
TOPICAL | Status: DC | PRN
  Administered 2023-07-25: 1000 mL

## 2023-07-25 MED ORDER — FLUORESCEIN SODIUM 10 % IV SOLN
INTRAVENOUS | Status: DC | PRN
  Administered 2023-07-25: 500 mg via INTRAVENOUS

## 2023-07-25 MED ORDER — DEXAMETHASONE SODIUM PHOSPHATE 10 MG/ML IJ SOLN
INTRAMUSCULAR | Status: AC
  Filled 2023-07-25: qty 1

## 2023-07-25 MED ORDER — VASOPRESSIN 20 UNIT/ML IV SOLN
INTRAVENOUS | Status: DC | PRN
  Administered 2023-07-25: 10 mL via INTRAMUSCULAR

## 2023-07-25 MED ORDER — LIDOCAINE 2% (20 MG/ML) 5 ML SYRINGE
INTRAMUSCULAR | Status: AC
  Filled 2023-07-25: qty 5

## 2023-07-25 MED ORDER — ONDANSETRON HCL 4 MG/2ML IJ SOLN: 4.0000 mg | Freq: Four times a day (QID) | INTRAMUSCULAR | Status: DC | PRN

## 2023-07-25 MED ORDER — DOCUSATE SODIUM 100 MG PO CAPS
100.0000 mg | ORAL_CAPSULE | Freq: Two times a day (BID) | ORAL | Status: DC
  Administered 2023-07-25 – 2023-07-26 (×2): 100 mg via ORAL
  Filled 2023-07-25 (×2): qty 1

## 2023-07-25 MED ORDER — PHENYLEPHRINE 80 MCG/ML (10ML) SYRINGE FOR IV PUSH (FOR BLOOD PRESSURE SUPPORT)
PREFILLED_SYRINGE | INTRAVENOUS | Status: DC | PRN
  Administered 2023-07-25: 160 ug via INTRAVENOUS
  Administered 2023-07-25: 80 ug via INTRAVENOUS
  Administered 2023-07-25: 160 ug via INTRAVENOUS

## 2023-07-25 MED ORDER — PHENYLEPHRINE HCL-NACL 20-0.9 MG/250ML-% IV SOLN
INTRAVENOUS | Status: DC | PRN
  Administered 2023-07-25: 50 ug/min via INTRAVENOUS

## 2023-07-25 MED ORDER — ESTRADIOL 0.1 MG/GM VA CREA
TOPICAL_CREAM | VAGINAL | Status: DC | PRN
  Administered 2023-07-25: 1 via VAGINAL

## 2023-07-25 MED ORDER — BUPIVACAINE HCL (PF) 0.25 % IJ SOLN
INTRAMUSCULAR | Status: DC | PRN
  Administered 2023-07-25: 6 mL

## 2023-07-25 MED ORDER — MENTHOL 3 MG MT LOZG: 1.0000 | LOZENGE | OROMUCOSAL | Status: DC | PRN

## 2023-07-25 MED ORDER — LACTATED RINGERS IV SOLN: INTRAVENOUS | Status: DC | PRN

## 2023-07-25 MED ORDER — SODIUM CHLORIDE 0.9% FLUSH: 9.0000 mL | INTRAVENOUS | Status: DC | PRN

## 2023-07-25 MED ORDER — FENTANYL CITRATE (PF) 250 MCG/5ML IJ SOLN
INTRAMUSCULAR | Status: DC | PRN
  Administered 2023-07-25: 100 ug via INTRAVENOUS

## 2023-07-25 MED ORDER — DEXTROSE IN LACTATED RINGERS 5 % IV SOLN: INTRAVENOUS | Status: DC

## 2023-07-25 MED ORDER — NALOXONE HCL 0.4 MG/ML IJ SOLN: 0.4000 mg | INTRAMUSCULAR | Status: DC | PRN

## 2023-07-25 MED ORDER — OXYCODONE HCL 5 MG/5ML PO SOLN: 5.0000 mg | Freq: Once | ORAL | Status: DC | PRN

## 2023-07-25 MED ORDER — HYDROMORPHONE HCL 1 MG/ML IJ SOLN
0.2500 mg | INTRAMUSCULAR | Status: DC | PRN
  Administered 2023-07-25 (×2): 0.25 mg via INTRAVENOUS

## 2023-07-25 MED ORDER — ONDANSETRON HCL 4 MG/2ML IJ SOLN
INTRAMUSCULAR | Status: DC | PRN
  Administered 2023-07-25: 4 mg via INTRAVENOUS

## 2023-07-25 MED ORDER — METOCLOPRAMIDE HCL 5 MG/5ML PO SOLN
10.0000 mg | Freq: Three times a day (TID) | ORAL | Status: DC
  Administered 2023-07-25 – 2023-07-26 (×3): 10 mg via ORAL
  Filled 2023-07-25 (×6): qty 10

## 2023-07-25 MED ORDER — LIDOCAINE 2% (20 MG/ML) 5 ML SYRINGE
INTRAMUSCULAR | Status: DC | PRN
  Administered 2023-07-25: 80 mg via INTRAVENOUS

## 2023-07-25 MED ORDER — HYDROXYZINE HCL 50 MG PO TABS
25.0000 mg | ORAL_TABLET | Freq: Four times a day (QID) | ORAL | Status: DC | PRN
  Administered 2023-07-25 – 2023-07-26 (×2): 25 mg via ORAL
  Filled 2023-07-25 (×2): qty 1

## 2023-07-25 MED ORDER — CHLORHEXIDINE GLUCONATE 0.12 % MT SOLN
15.0000 mL | Freq: Once | OROMUCOSAL | Status: AC
  Administered 2023-07-25: 15 mL via OROMUCOSAL
  Filled 2023-07-25: qty 15

## 2023-07-25 MED ORDER — DEXAMETHASONE SODIUM PHOSPHATE 10 MG/ML IJ SOLN
INTRAMUSCULAR | Status: DC | PRN
  Administered 2023-07-25: 5 mg via INTRAVENOUS

## 2023-07-25 MED ORDER — PANTOPRAZOLE SODIUM 40 MG PO TBEC
40.0000 mg | DELAYED_RELEASE_TABLET | Freq: Every day | ORAL | Status: DC
  Administered 2023-07-25: 40 mg via ORAL
  Filled 2023-07-25 (×2): qty 1

## 2023-07-25 MED ORDER — ONDANSETRON HCL 4 MG/2ML IJ SOLN
INTRAMUSCULAR | Status: AC
  Filled 2023-07-25: qty 2

## 2023-07-25 MED ORDER — HYDROMORPHONE HCL 1 MG/ML IJ SOLN
INTRAMUSCULAR | Status: AC
  Filled 2023-07-25: qty 1

## 2023-07-25 MED ORDER — MIDAZOLAM HCL 2 MG/2ML IJ SOLN
INTRAMUSCULAR | Status: AC
  Filled 2023-07-25: qty 2

## 2023-07-25 MED ORDER — SUGAMMADEX SODIUM 200 MG/2ML IV SOLN
INTRAVENOUS | Status: DC | PRN
  Administered 2023-07-25: 200 mg via INTRAVENOUS

## 2023-07-25 MED ORDER — MIDAZOLAM HCL 2 MG/2ML IJ SOLN
INTRAMUSCULAR | Status: DC | PRN
  Administered 2023-07-25 (×2): 1 mg via INTRAVENOUS

## 2023-07-25 MED ORDER — FENTANYL CITRATE (PF) 250 MCG/5ML IJ SOLN
INTRAMUSCULAR | Status: AC
  Filled 2023-07-25: qty 5

## 2023-07-25 MED ORDER — HYDROMORPHONE 1 MG/ML IV SOLN
INTRAVENOUS | Status: AC
  Administered 2023-07-25: 0.2 mg
  Filled 2023-07-25: qty 30

## 2023-07-25 MED ORDER — ORAL CARE MOUTH RINSE: 15.0000 mL | Freq: Once | OROMUCOSAL | Status: AC

## 2023-07-25 MED ORDER — SODIUM CHLORIDE 0.9 % IR SOLN
Status: DC | PRN
  Administered 2023-07-25: 1000 mL

## 2023-07-25 MED ORDER — CEFAZOLIN SODIUM-DEXTROSE 2-3 GM-%(50ML) IV SOLR
INTRAVENOUS | Status: DC | PRN
  Administered 2023-07-25: 2 g via INTRAVENOUS

## 2023-07-25 MED ORDER — PROPOFOL 10 MG/ML IV BOLUS
INTRAVENOUS | Status: DC | PRN
  Administered 2023-07-25: 140 mg via INTRAVENOUS

## 2023-07-25 MED ORDER — HEMOSTATIC AGENTS (NO CHARGE) OPTIME
TOPICAL | Status: DC | PRN
Start: 2023-07-25 — End: 2023-07-25
  Administered 2023-07-25: 1 via TOPICAL

## 2023-07-25 MED ORDER — OXYCODONE HCL 5 MG PO TABS: 5.0000 mg | ORAL_TABLET | Freq: Once | ORAL | Status: DC | PRN

## 2023-07-25 MED ORDER — GABAPENTIN 300 MG PO CAPS
300.0000 mg | ORAL_CAPSULE | Freq: Once | ORAL | Status: AC
Start: 2023-07-25 — End: 2023-07-25
  Administered 2023-07-25: 300 mg via ORAL
  Filled 2023-07-25: qty 1

## 2023-07-25 MED ORDER — ROCURONIUM BROMIDE 10 MG/ML (PF) SYRINGE
PREFILLED_SYRINGE | INTRAVENOUS | Status: AC
  Filled 2023-07-25: qty 10

## 2023-07-25 MED ORDER — ACETAMINOPHEN 500 MG PO TABS: 1000.0000 mg | ORAL_TABLET | Freq: Once | ORAL | Status: DC

## 2023-07-25 SURGICAL SUPPLY — 78 items
CANISTER SUCT 3000ML PPV (MISCELLANEOUS) ×2 IMPLANT
CATH FOLEY 3WAY 5CC 16FR (CATHETERS) IMPLANT
COVER BACK TABLE 60X90IN (DRAPES) ×3 IMPLANT
COVER MAYO STAND STRL (DRAPES) ×3 IMPLANT
DERMABOND ADVANCED .7 DNX12 (GAUZE/BANDAGES/DRESSINGS) ×3 IMPLANT
DRAPE WARM FLUID 44X44 (DRAPES) IMPLANT
DRSG OPSITE POSTOP 4X10 (GAUZE/BANDAGES/DRESSINGS) ×3 IMPLANT
DURAPREP 26ML APPLICATOR (WOUND CARE) ×3 IMPLANT
ELECT REM PT RETURN 9FT ADLT (ELECTROSURGICAL) ×2
ELECTRODE REM PT RTRN 9FT ADLT (ELECTROSURGICAL) ×2 IMPLANT
FORCEPS CUTTING 33CM 5MM (CUTTING FORCEPS) IMPLANT
GAUZE 4X4 16PLY ~~LOC~~+RFID DBL (SPONGE) IMPLANT
GAUZE PACKING 1INX5YD STRL (GAUZE/BANDAGES/DRESSINGS) IMPLANT
GAUZE SPONGE 4X4 16PLY XRAY LF (GAUZE/BANDAGES/DRESSINGS) ×3 IMPLANT
GAUZE VASELINE 3X9 (GAUZE/BANDAGES/DRESSINGS) IMPLANT
GLOVE BIO SURGEON STRL SZ 6.5 (GLOVE) ×6 IMPLANT
GLOVE BIOGEL PI IND STRL 7.0 (GLOVE) ×12 IMPLANT
GLOVE SURG UNDER POLY LF SZ7 (GLOVE) ×12 IMPLANT
GOWN STRL REUS W/ TWL LRG LVL3 (GOWN DISPOSABLE) ×6 IMPLANT
GOWN STRL REUS W/TWL LRG LVL3 (GOWN DISPOSABLE) ×4
HEMOSTAT ARISTA ABSORB 3G PWDR (HEMOSTASIS) IMPLANT
HIBICLENS CHG 4% 4OZ BTL (MISCELLANEOUS) ×3 IMPLANT
IRRIG SUCT STRYKERFLOW 2 WTIP (MISCELLANEOUS)
IRRIGATION SUCT STRKRFLW 2 WTP (MISCELLANEOUS) IMPLANT
KIT PINK PAD W/HEAD ARE REST (MISCELLANEOUS) ×2
KIT PINK PAD W/HEAD ARM REST (MISCELLANEOUS) ×3 IMPLANT
KIT TURNOVER KIT B (KITS) ×3 IMPLANT
NDL HYPO 22X1.5 SAFETY MO (MISCELLANEOUS) IMPLANT
NDL MAYO CATGUT SZ4 TPR NDL (NEEDLE) ×2 IMPLANT
NDL SUT 2 .5 CRC MAYO 1.732X (NEEDLE) IMPLANT
NEEDLE HYPO 22X1.5 SAFETY MO (MISCELLANEOUS) IMPLANT
NEEDLE MAYO CATGUT SZ4 (NEEDLE) ×2 IMPLANT
NEEDLE MAYO TAPER (NEEDLE) ×2
NS IRRIG 1000ML POUR BTL (IV SOLUTION) ×3 IMPLANT
PACK ABDOMINAL GYN (CUSTOM PROCEDURE TRAY) ×3 IMPLANT
PACK LAVH (CUSTOM PROCEDURE TRAY) ×3 IMPLANT
PAD ARMBOARD 7.5X6 YLW CONV (MISCELLANEOUS) ×2 IMPLANT
PAD OB MATERNITY 4.3X12.25 (PERSONAL CARE ITEMS) ×3 IMPLANT
POUCH LAPAROSCOPIC INSTRUMENT (MISCELLANEOUS) ×3 IMPLANT
SET CYSTO W/LG BORE CLAMP LF (SET/KITS/TRAYS/PACK) ×3 IMPLANT
SET TUBE SMOKE EVAC HIGH FLOW (TUBING) ×3 IMPLANT
SHEET LAVH (DRAPES) ×3 IMPLANT
SLEEVE Z-THREAD 5X100MM (TROCAR) ×2 IMPLANT
SOL ELECTROSURG ANTI STICK (MISCELLANEOUS) ×2
SOLUTION ELECTROSURG ANTI STCK (MISCELLANEOUS) ×3 IMPLANT
SPECIMEN JAR MEDIUM (MISCELLANEOUS) ×2 IMPLANT
SPIKE FLUID TRANSFER (MISCELLANEOUS) ×6 IMPLANT
SPONGE INTESTINAL PEANUT (DISPOSABLE) IMPLANT
SPONGE SURGIFOAM ABS GEL 12-7 (HEMOSTASIS) IMPLANT
SPONGE T-LAP 18X18 ~~LOC~~+RFID (SPONGE) ×4 IMPLANT
SPONGE T-LAP 4X18 ~~LOC~~+RFID (SPONGE) ×3 IMPLANT
STAPLER VISISTAT 35W (STAPLE) IMPLANT
STRIP CLOSURE SKIN 1/2X4 (GAUZE/BANDAGES/DRESSINGS) ×2 IMPLANT
SUT CHROMIC 0 CT 1 (SUTURE) ×3 IMPLANT
SUT CHROMIC 0 UR 5 27 (SUTURE) ×6 IMPLANT
SUT CHROMIC 2 0 UR 5 27 (SUTURE) IMPLANT
SUT MNCRL AB 3-0 PS2 27 (SUTURE) ×6 IMPLANT
SUT MON AB 3-0 SH 27 (SUTURE) ×2
SUT MON AB 3-0 SH27 (SUTURE) ×3 IMPLANT
SUT PDS AB 0 CT1 27 (SUTURE) IMPLANT
SUT PLAIN 2 0 XLH (SUTURE) ×3 IMPLANT
SUT VIC AB 0 CT1 18XCR BRD8 (SUTURE) ×9 IMPLANT
SUT VIC AB 0 CT1 27 (SUTURE) ×2
SUT VIC AB 0 CT1 27XBRD ANBCTR (SUTURE) ×12 IMPLANT
SUT VIC AB 0 CT1 36 (SUTURE) IMPLANT
SUT VIC AB 0 CT1 8-18 (SUTURE) ×6
SUT VIC AB 2-0 SH 27 (SUTURE) ×2
SUT VIC AB 2-0 SH 27XBRD (SUTURE) ×3 IMPLANT
SUT VIC AB CT1 27XBRD ANBCTRL (SUTURE) ×2
SUT VICRYL 0 ENDOLOOP (SUTURE) IMPLANT
SUT VICRYL 0 TIES 12 18 (SUTURE) ×3 IMPLANT
SUT VICRYL 0 UR6 27IN ABS (SUTURE) ×3 IMPLANT
SYR BULB IRRIG 60ML STRL (SYRINGE) ×3 IMPLANT
SYR CONTROL 10ML LL (SYRINGE) IMPLANT
TOWEL GREEN STERILE FF (TOWEL DISPOSABLE) ×4 IMPLANT
TRAY FOLEY W/BAG SLVR 14FR (SET/KITS/TRAYS/PACK) ×2 IMPLANT
TROCAR BALLN 12MMX100 BLUNT (TROCAR) ×2 IMPLANT
UNDERPAD 30X36 HEAVY ABSORB (UNDERPADS AND DIAPERS) ×3 IMPLANT

## 2023-07-25 NOTE — Anesthesia Postprocedure Evaluation (Signed)
Anesthesia Post Note  Patient: Ann Rodgers  Procedure(s) Performed: LAPAROSCOPIC ASSISTED VAGINAL HYSTERECTOMY WITH OOPHORECTOMY, SALPINGECTOMY (Bilateral) CYSTOSCOPY     Patient location during evaluation: PACU Anesthesia Type: General Level of consciousness: sedated and patient cooperative Pain management: pain level controlled Vital Signs Assessment: post-procedure vital signs reviewed and stable Respiratory status: spontaneous breathing Cardiovascular status: stable Anesthetic complications: no   No notable events documented.  Last Vitals:  Vitals:   07/25/23 1516 07/25/23 1522  BP: (!) 107/55   Pulse: 78   Resp: 16 15  Temp: 36.6 C   SpO2: 94% 92%    Last Pain:  Vitals:   07/25/23 1522  TempSrc:   PainSc: Asleep                 Lewie Loron

## 2023-07-25 NOTE — Transfer of Care (Signed)
Immediate Anesthesia Transfer of Care Note  Patient: Ann Rodgers  Procedure(s) Performed: LAPAROSCOPIC ASSISTED VAGINAL HYSTERECTOMY WITH OOPHORECTOMY CYSTOSCOPY HYSTERECTOMY ABDOMINAL  Patient Location: PACU  Anesthesia Type:General  Level of Consciousness: awake, alert , oriented, patient cooperative, and responds to stimulation  Airway & Oxygen Therapy: Patient Spontanous Breathing and Patient connected to face mask oxygen  Post-op Assessment: Report given to RN, Post -op Vital signs reviewed and stable, and Patient moving all extremities X 4  Post vital signs: Reviewed and stable  Last Vitals:  Vitals Value Taken Time  BP 101/58 07/25/23 1400  Temp 36.7 C 07/25/23 1358  Pulse 80 07/25/23 1405  Resp 21 07/25/23 1405  SpO2 99 % 07/25/23 1405  Vitals shown include unfiled device data.  Last Pain:  Vitals:   07/25/23 1358  TempSrc:   PainSc: 0-No pain      Patients Stated Pain Goal: 0 (07/25/23 0748)  Complications: No notable events documented.

## 2023-07-25 NOTE — Op Note (Signed)
agnosis: menorrhagia.  Failed ablation  Postop Diagnosis:same   Procedure: LAVH, B salpingo oophorectomy , Cystoscopy  Anesthesia: General   Anesthesiologist:   Attending: Michael Litter, MD   Assistant: Hoover Browns MD  Findings: normal size uterus. Normal appearing tubes and ovaries.   Pathology: uterus and cervix and bilateral tubes and ovaries  Fluids: 1450 cccrystalloid  UOP: 300cc  EBL: 200cc  Complications:none  Procedure: The patient was taken to the operating room, placed under general anesthesia and prepped and draped in the normal sterile fashion. A Foley catheter was placed in the bladder. A weighted speculum and vaginal retractors were placed in the vagina. Tenaculum was placed on the anterior lip of the cervix.  A hulka manipulator was placed in the uterus. Attention was then turned to the abdomen. A 10 mm infraumbilical incision was made with the scalpel after 5 cc of 25% percent Marcaine was used for local anesthesia. The subcutaneous tissue was dissected and the fascia was incised with the knife. A purse string stitch was placed in the fascia and Hassan placed into the intra-abdominal cavity and anchored to the suture. Intraabdominal placement was confirmed with the laparoscope.  Two 5 mm trochars were placed in the right and left lower quadrants under direct visualization with the laparoscope.   .   Both round ligaments were cauterized and cut with the gyrus bipolar cautery as well and the bladder flap created with the gyrus and hydrodisection using the nigat and removed away from the uterus.    The left fallopian tube and ovary was placed in twoendoloops and then  was cauterized and removed.    The right uterine ovarian ligament was then cauterized and cut.  The right fallopian tube  and ovary was placed in two endoloops  and then was cauterized cut and removed.  There was some adhesions from the bowel to the left tube and ovary which were lysed sharply.  . Attention was  then turned to the vagina.  A weighted speculum was placed in the posterior fourchette.  petrussin mixture was placed circumferentially around the cervix.  With blunt and sharp dissection the cervix was dissected away from the bowel and bladder.  Both uterosacral ligaments were clamped, cut and suture ligated and held.  The anterior and posterior culdesac was entered sharply using metzenbaum scissors.  The cardinal ligaments and  The uterine arteries were clamped, cut and suture ligated bilaterally.    The uterus was then delivered.  The mcall suture was placed.   The vaginal cuff was closed with interrupted suture of 2*-0chromic.   the patient was given fluroscein.  Cystoscopy was performed and both ureters were seen to efflux fluroscein  without difficulty. The bladder had full integrity with no suture or laceration visualized.  The vagina was inspected and the cuff was noted to be intact.  Attention was then turned back to the abdomen after removing top pair of gloves. The abdomen was reinsufflated with CO2 gas.   The abdomen and pelvis was copiously irrigated.       hemostasis was noted.  Arista  was placed along the cuff.  There was slight oozing noted.    All trochars were removed under direct visualization using the laparoscope.  The umbilical fascia was reapproximated by tying the circumferential suture. The two 5 mm incisions were closed with 3-0 Monocryl via a subcuticular stitch.  All remaining skin incisions were closed with Dermabond and the 10 mm skin incisions were reinforced using Dermabond.  Sponge  lap and needle counts were correct.  The patient tolerated the procedure well and was returned to the PACU in stable condition

## 2023-07-25 NOTE — Anesthesia Procedure Notes (Signed)
Procedure Name: Intubation Date/Time: 07/25/2023 10:36 AM  Performed by: Shary Decamp, CRNAPre-anesthesia Checklist: Patient identified, Patient being monitored, Timeout performed, Emergency Drugs available and Suction available Patient Re-evaluated:Patient Re-evaluated prior to induction Oxygen Delivery Method: Circle System Utilized Preoxygenation: Pre-oxygenation with 100% oxygen Induction Type: IV induction Ventilation: Mask ventilation without difficulty Laryngoscope Size: Miller and 2 Grade View: Grade I Tube type: Oral Tube size: 7.0 mm Number of attempts: 1 Airway Equipment and Method: Stylet Placement Confirmation: ETT inserted through vocal cords under direct vision, positive ETCO2 and breath sounds checked- equal and bilateral Secured at: 21 cm Tube secured with: Tape Dental Injury: Teeth and Oropharynx as per pre-operative assessment

## 2023-07-25 NOTE — H&P (Signed)
Ann Rodgers is an 72 y.o. female. Who presents for hysterectomy  for irregular bleeding and pain for 9 months.  She had a D&C 6/24 which was benign.  She was offered provera but declined.  She wants definitive treatment.  She has been medically cleared from her PCP for surgery .   Pertinent Gynecological History: Menses: post-menopausal Bleeding: post menopausal bleeding Contraception: none DES exposure: denies Blood transfusions: none Sexually transmitted diseases: no past history Previous GYN Procedures: DNC  Last mammogram: normal Date: 2023 Last pap: normal Date: 2022 OB History: G2, P2   Menstrual History: Menarche age: 87 No LMP recorded. Patient is postmenopausal.    Past Medical History:  Diagnosis Date   Anemia    Bell palsy    Fibromyalgia    GERD (gastroesophageal reflux disease)    Hypertension    Lupus    Thyroid disease     Past Surgical History:  Procedure Laterality Date   AUGMENTATION MAMMAPLASTY Bilateral    BREAST BIOPSY Left    BREAST EXCISIONAL BIOPSY Right    benign   BREAST LUMPECTOMY     x2   COLONOSCOPY  2016   POLYPECTOMY      Family History  Problem Relation Age of Onset   Stroke Mother    Colon cancer Neg Hx    Colon polyps Neg Hx    Esophageal cancer Neg Hx    Rectal cancer Neg Hx    Stomach cancer Neg Hx     Social History:  reports that she quit smoking about 8 years ago. Her smoking use included cigarettes. She started smoking about 13 years ago. She has a 7.5 pack-year smoking history. She has never used smokeless tobacco. She reports that she does not drink alcohol and does not use drugs.  Allergies:  Allergies  Allergen Reactions   Codeine     "hot fever" and abdominal pain   Sulfa Antibiotics Nausea And Vomiting   Sulfamethoxazole-Trimethoprim Nausea And Vomiting   Penicillins Rash    Medications Prior to Admission  Medication Sig Dispense Refill Last Dose   acetaminophen (TYLENOL) 500 MG tablet Take 1,000 mg  by mouth every 6 (six) hours as needed for moderate pain (pain score 4-6).   07/25/2023 at 0630   amLODipine (NORVASC) 10 MG tablet Take 10 mg by mouth at bedtime.   07/24/2023   aspirin EC 81 MG tablet Take 81 mg by mouth daily.   Past Week   cholecalciferol (VITAMIN D3) 25 MCG (1000 UNIT) tablet Take 1,000 Units by mouth daily.   Past Week   cyclobenzaprine (FLEXERIL) 5 MG tablet Take 5 mg by mouth 3 (three) times daily as needed for muscle spasms.   Past Week   escitalopram (LEXAPRO) 5 MG tablet Take 5 mg by mouth daily.   07/25/2023 at 0630   fluticasone (FLONASE) 50 MCG/ACT nasal spray Place 1 spray into both nostrils daily as needed for allergies or rhinitis.   Past Week   gabapentin (NEURONTIN) 100 MG capsule Take 100 mg by mouth 3 (three) times daily as needed (pain).   Past Week   losartan-hydrochlorothiazide (HYZAAR) 100-12.5 MG tablet Take 1 tablet by mouth daily.   07/24/2023   naproxen sodium (ANAPROX) 550 MG tablet Take 550 mg by mouth every 12 (twelve) hours as needed for moderate pain (pain score 4-6).   Past Week   pantoprazole (PROTONIX) 40 MG tablet Take 40 mg by mouth daily.   07/24/2023   Polyethyl Glycol-Propyl Glycol (SYSTANE  OP) Place 1 drop into both eyes daily as needed (dry eyes).   Past Week   Potassium Chloride ER 20 MEQ TBCR Take 20 mEq by mouth daily.   07/24/2023   rosuvastatin (CRESTOR) 10 MG tablet Take 10 mg by mouth daily.   07/25/2023 at 0600   SYNTHROID 75 MCG tablet Take 75 mcg by mouth daily before breakfast.    07/25/2023 at 0600   traMADol (ULTRAM) 50 MG tablet Take 50 mg by mouth 2 (two) times daily as needed for moderate pain (pain score 4-6).   07/24/2023   diphenhydrAMINE (BENADRYL) 25 mg capsule Take 50 mg by mouth every 6 (six) hours as needed for allergies.   More than a month    Review of Systems  Blood pressure 108/76, pulse 91, temperature 97.9 F (36.6 C), temperature source Oral, resp. rate 18, height 5\' 2"  (1.575 m), weight 93 kg, SpO2  96%. Physical Exam Physical Examination: General appearance - alert, well appearing, and in no distress Chest - clear to auscultation, no wheezes, rales or rhonchi, symmetric air entry Heart - normal rate and regular rhythm Abdomen - soft, nontender, nondistended, no masses or organomegaly Pelvic - normal external genitalia, vulva, vagina, cervix, uterus and adnexa Extremities - Homan's sign negative bilaterally  Results for orders placed or performed during the hospital encounter of 07/25/23 (from the past 24 hour(s))  ABO/Rh     Status: None   Collection Time: 07/25/23  8:10 AM  Result Value Ref Range   ABO/RH(D)      O POS Performed at [facility], [address], [place], [place] [address]    CBC    Component Value Date/Time   WBC 5.4 07/19/2023 1459   RBC 4.00 07/19/2023 1459   HGB 11.2 (L) 07/19/2023 1459   HCT 35.8 (L) 07/19/2023 1459   PLT 341 07/19/2023 1459   MCV 89.5 07/19/2023 1459   MCH 28.0 07/19/2023 1459   MCHC 31.3 07/19/2023 1459   RDW 13.2 07/19/2023 1459    No results found.  Assessment/Plan: Plan LAVH BSO possible TAH with cysto Pt understands the risks are but not limited to bleeding, infection , damage to to internal organs like bowel , bladder or other internal organs Pt understands and desires to proceed.    07/25/2023, 9:55 AM

## 2023-07-26 ENCOUNTER — Encounter (HOSPITAL_COMMUNITY): Payer: Self-pay | Admitting: Obstetrics and Gynecology

## 2023-07-26 DIAGNOSIS — N920 Excessive and frequent menstruation with regular cycle: Secondary | ICD-10-CM | POA: Diagnosis not present

## 2023-07-26 LAB — BASIC METABOLIC PANEL
Anion gap: 9 (ref 5–15)
BUN: 5 mg/dL — ABNORMAL LOW (ref 8–23)
CO2: 22 mmol/L (ref 22–32)
Calcium: 9.3 mg/dL (ref 8.9–10.3)
Chloride: 104 mmol/L (ref 98–111)
Creatinine, Ser: 0.87 mg/dL (ref 0.44–1.00)
GFR, Estimated: >60 mL/min (ref >60)
Glucose, Bld: 174 mg/dL — ABNORMAL HIGH (ref 70–99)
Potassium: 4 mmol/L (ref 3.5–5.1)
Sodium: 135 mmol/L (ref 135–145)

## 2023-07-26 LAB — CBC
HCT: 32.5 % — ABNORMAL LOW (ref 36.0–46.0)
Hemoglobin: 10.1 g/dL — ABNORMAL LOW (ref 12.0–15.0)
MCH: 27.7 pg (ref 26.0–34.0)
MCHC: 31.1 g/dL (ref 30.0–36.0)
MCV: 89.3 fL (ref 80.0–100.0)
Platelets: 333 10*3/uL (ref 150–400)
RBC: 3.64 MIL/uL — ABNORMAL LOW (ref 3.87–5.11)
RDW: 13.3 % (ref 11.5–15.5)
WBC: 11.7 10*3/uL — ABNORMAL HIGH (ref 4.0–10.5)
nRBC: 0 % (ref 0.0–0.2)

## 2023-07-26 MED ORDER — METOCLOPRAMIDE HCL 10 MG PO TABS: 10.0000 mg | ORAL_TABLET | Freq: Three times a day (TID) | ORAL | 0 refills | Status: AC

## 2023-07-26 MED ORDER — HYDROXYZINE HCL 25 MG PO TABS: 25.0000 mg | ORAL_TABLET | Freq: Four times a day (QID) | ORAL | 0 refills | Status: DC | PRN

## 2023-07-26 MED ORDER — HYDROMORPHONE HCL 2 MG PO TABS: 1.0000 mg | ORAL_TABLET | Freq: Four times a day (QID) | ORAL | 0 refills | Status: DC | PRN

## 2023-07-26 MED ORDER — ONDANSETRON HCL 4 MG PO TABS: 4.0000 mg | ORAL_TABLET | Freq: Four times a day (QID) | ORAL | 0 refills | Status: AC | PRN

## 2023-07-26 MED ORDER — TRAMADOL HCL 50 MG PO TABS: 50.0000 mg | ORAL_TABLET | Freq: Four times a day (QID) | ORAL | 0 refills | Status: DC | PRN

## 2023-07-26 MED ORDER — DOCUSATE SODIUM 100 MG PO CAPS: 100.0000 mg | ORAL_CAPSULE | Freq: Two times a day (BID) | ORAL | 0 refills | Status: DC

## 2023-07-27 LAB — SURGICAL PATHOLOGY

## 2023-08-03 NOTE — Discharge Summary (Signed)
Physician Discharge Summary  Patient ID: Ann Rodgers MRN: [id number] DOB/AGE: Jun 26, 1951 72 y.o.  Admit date: 07/25/2023 Discharge date: 08/03/2023  Admission Diagnoses:  Discharge Diagnoses:  Principal Problem:   S/P laparoscopic assisted vaginal hysterectomy (LAVH)   Discharged Condition: good  Hospital Course: Pt had an LAVH BSO.  She is tolerating food and voiding.  Her exam and labs are stable.    Consults: None  Significant Diagnostic Studies: labs: reviewed  Treatments: IV hydration and surgery: LAVH BSO cysto  Discharge Exam: Blood pressure 119/64, pulse 86, temperature 98.2 F (36.8 C), temperature source Oral, resp. rate (!) 21, height 5\' 2"  (1.575 m), weight 93 kg, SpO2 95%. General appearance: alert and cooperative Resp: clear to auscultation bilaterally Cardio: regular rate and rhythm GI: soft, non-tender; bowel sounds normal; no masses,  no organomegaly and protuberant Pelvic: packing Extremities: Homans sign is negative, no sign of DVT  Disposition: Discharge disposition: 01-Home or Self Care       Discharge Instructions     Call MD for:  persistant dizziness or light-headedness   Complete by: As directed    Call MD for:  severe uncontrolled pain   Complete by: As directed    Call MD for:  temperature >100.4   Complete by: As directed    Diet - low sodium heart healthy   Complete by: As directed    Discharge instructions   Complete by: As directed    Call for vaginal bleeding if you soak greater than pad per hour   Increase activity slowly   Complete by: As directed    May shower / Bathe   Complete by: As directed    Sexual Activity Restrictions   Complete by: As directed    Pelvic rest.  Nothing in the vagina      Allergies as of 07/26/2023       Reactions   Codeine    "hot fever" and abdominal pain   Sulfa Antibiotics Nausea And Vomiting   Sulfamethoxazole-trimethoprim Nausea And Vomiting   Penicillins Rash         Medication List     TAKE these medications    acetaminophen 500 MG tablet Commonly known as: TYLENOL Take 1,000 mg by mouth every 6 (six) hours as needed for moderate pain (pain score 4-6).   amLODipine 10 MG tablet Commonly known as: NORVASC Take 10 mg by mouth at bedtime.   aspirin EC 81 MG tablet Take 81 mg by mouth daily.   cholecalciferol 25 MCG (1000 UNIT) tablet Commonly known as: VITAMIN D3 Take 1,000 Units by mouth daily.   cyclobenzaprine 5 MG tablet Commonly known as: FLEXERIL Take 5 mg by mouth 3 (three) times daily as needed for muscle spasms.   diphenhydrAMINE 25 mg capsule Commonly known as: BENADRYL Take 50 mg by mouth every 6 (six) hours as needed for allergies.   docusate sodium 100 MG capsule Commonly known as: COLACE Take 1 capsule (100 mg total) by mouth 2 (two) times daily.   escitalopram 5 MG tablet Commonly known as: LEXAPRO Take 5 mg by mouth daily.   fluticasone 50 MCG/ACT nasal spray Commonly known as: FLONASE Place 1 spray into both nostrils daily as needed for allergies or rhinitis.   gabapentin 100 MG capsule Commonly known as: NEURONTIN Take 100 mg by mouth 3 (three) times daily as needed (pain).   HYDROmorphone 2 MG tablet Commonly known as: Dilaudid Take 0.5 tablets (1 mg total) by mouth every 6 (six) hours as  needed for severe pain (pain score 7-10).   hydrOXYzine 25 MG tablet Commonly known as: ATARAX Take 1 tablet (25 mg total) by mouth every 6 (six) hours as needed (itching).   losartan-hydrochlorothiazide 100-12.5 MG tablet Commonly known as: HYZAAR Take 1 tablet by mouth daily.   metoCLOPramide 10 MG tablet Commonly known as: Reglan Take 1 tablet (10 mg total) by mouth 4 (four) times daily -  before meals and at bedtime for 6 days.   naproxen sodium 550 MG tablet Commonly known as: ANAPROX Take 550 mg by mouth every 12 (twelve) hours as needed for moderate pain (pain score 4-6).   ondansetron 4 MG  tablet Commonly known as: ZOFRAN Take 1 tablet (4 mg total) by mouth every 6 (six) hours as needed for nausea.   pantoprazole 40 MG tablet Commonly known as: PROTONIX Take 40 mg by mouth daily.   Potassium Chloride ER 20 MEQ Tbcr Take 20 mEq by mouth daily.   rosuvastatin 10 MG tablet Commonly known as: CRESTOR Take 10 mg by mouth daily.   Synthroid 75 MCG tablet Generic drug: levothyroxine Take 75 mcg by mouth daily before breakfast.   SYSTANE OP Place 1 drop into both eyes daily as needed (dry eyes).   traMADol 50 MG tablet Commonly known as: ULTRAM Take 1 tablet (50 mg total) by mouth every 6 (six) hours as needed (mild pain). What changed:  when to take this reasons to take this        Follow-up Information     Amillion Macchia, MD Follow up in 1 week(s).   Specialty: Obstetrics and Gynecology Why: TH IN ONE WEEK FU IN THE OFFICE IN 6 WEEKS Contact information: [address] [place] [address] [phone]                 Signed: Michael Litter 08/03/2023, 9:16 AM

## 2024-01-16 ENCOUNTER — Other Ambulatory Visit: Payer: Self-pay

## 2024-01-16 ENCOUNTER — Emergency Department (HOSPITAL_COMMUNITY)

## 2024-01-16 ENCOUNTER — Emergency Department (HOSPITAL_COMMUNITY)
Admission: EM | Admit: 2024-01-16 | Discharge: 2024-01-16 | Disposition: A | Discharge status: Home / Self Care | Attending: Student | Admitting: Student

## 2024-01-16 ENCOUNTER — Encounter (HOSPITAL_COMMUNITY): Payer: Self-pay | Admitting: *Deleted

## 2024-01-16 DIAGNOSIS — I10 Essential (primary) hypertension: Secondary | ICD-10-CM | POA: Insufficient documentation

## 2024-01-16 DIAGNOSIS — Z79899 Other long term (current) drug therapy: Secondary | ICD-10-CM | POA: Diagnosis not present

## 2024-01-16 DIAGNOSIS — Z7982 Long term (current) use of aspirin: Secondary | ICD-10-CM | POA: Diagnosis not present

## 2024-01-16 DIAGNOSIS — R1032 Left lower quadrant pain: Secondary | ICD-10-CM | POA: Diagnosis present

## 2024-01-16 DIAGNOSIS — Z87891 Personal history of nicotine dependence: Secondary | ICD-10-CM | POA: Insufficient documentation

## 2024-01-16 LAB — URINALYSIS, ROUTINE W REFLEX MICROSCOPIC
Bilirubin Urine: NEGATIVE
Glucose, UA: NEGATIVE mg/dL
Ketones, ur: NEGATIVE mg/dL
Leukocytes,Ua: NEGATIVE
Nitrite: NEGATIVE
Protein, ur: 30 mg/dL — AB
Specific Gravity, Urine: >1.046 — ABNORMAL HIGH (ref 1.005–1.030)
pH: 5 (ref 5.0–8.0)

## 2024-01-16 LAB — COMPREHENSIVE METABOLIC PANEL WITH GFR
ALT: 17 U/L (ref 0–44)
AST: 29 U/L (ref 15–41)
Albumin: 3.9 g/dL (ref 3.5–5.0)
Alkaline Phosphatase: 111 U/L (ref 38–126)
Anion gap: 13 (ref 5–15)
BUN: 12 mg/dL (ref 8–23)
CO2: 22 mmol/L (ref 22–32)
Calcium: 9.1 mg/dL (ref 8.9–10.3)
Chloride: 103 mmol/L (ref 98–111)
Creatinine, Ser: 0.66 mg/dL (ref 0.44–1.00)
GFR, Estimated: >60 mL/min (ref >60)
Glucose, Bld: 172 mg/dL — ABNORMAL HIGH (ref 70–99)
Potassium: 2.9 mmol/L — ABNORMAL LOW (ref 3.5–5.1)
Sodium: 138 mmol/L (ref 135–145)
Total Bilirubin: 0.6 mg/dL (ref 0.0–1.2)
Total Protein: 8.4 g/dL — ABNORMAL HIGH (ref 6.5–8.1)

## 2024-01-16 LAB — CBC
HCT: 34.4 % — ABNORMAL LOW (ref 36.0–46.0)
Hemoglobin: 10.4 g/dL — ABNORMAL LOW (ref 12.0–15.0)
MCH: 27.2 pg (ref 26.0–34.0)
MCHC: 30.2 g/dL (ref 30.0–36.0)
MCV: 90.1 fL (ref 80.0–100.0)
Platelets: 393 10*3/uL (ref 150–400)
RBC: 3.82 MIL/uL — ABNORMAL LOW (ref 3.87–5.11)
RDW: 14.5 % (ref 11.5–15.5)
WBC: 6.4 10*3/uL (ref 4.0–10.5)
nRBC: 0 % (ref 0.0–0.2)

## 2024-01-16 LAB — LIPASE, BLOOD: Lipase: 33 U/L (ref 11–51)

## 2024-01-16 MED ORDER — DICYCLOMINE HCL 20 MG PO TABS: 20.0000 mg | ORAL_TABLET | Freq: Two times a day (BID) | ORAL | 0 refills | Status: AC

## 2024-01-16 MED ORDER — DICYCLOMINE HCL 10 MG/ML IM SOLN
20.0000 mg | Freq: Once | INTRAMUSCULAR | Status: AC
  Administered 2024-01-16: 20 mg via INTRAMUSCULAR
  Filled 2024-01-16: qty 2

## 2024-01-16 MED ORDER — HYDROCODONE-ACETAMINOPHEN 5-325 MG PO TABS
1.0000 | ORAL_TABLET | Freq: Once | ORAL | Status: AC
  Administered 2024-01-16: 1 via ORAL
  Filled 2024-01-16 (×2): qty 1

## 2024-01-16 MED ORDER — SODIUM CHLORIDE (PF) 0.9 % IJ SOLN
INTRAMUSCULAR | Status: AC
Start: 2024-01-16 — End: ?
  Filled 2024-01-16: qty 50

## 2024-01-16 MED ORDER — IOHEXOL 300 MG/ML  SOLN
100.0000 mL | Freq: Once | INTRAMUSCULAR | Status: AC | PRN
  Administered 2024-01-16: 100 mL via INTRAVENOUS

## 2024-01-16 NOTE — ED Provider Notes (Signed)
 [facility] EMERG[facility]Y DEPARTMENT AT [facility] Provider Note  CSN: [id number] Arrival date & time: 01/16/24 1116  Chief Complaint(s) Abdominal Pain  HPI Ann Rodgers is a 73 y.o. female with PMH fibromyalgia, GERD, HTN, lupus, who presents emergency room for evaluation of abdominal cramping.  States that over the last 2 weeks she has had persistent cramping with intermittent nausea.  Denies dysuria, increased frequency, chest pain, shortness of breath, headache, fever or other systemic symptoms.   Past Medical History Past Medical History:  Diagnosis Date   Anemia    Bell palsy    Fibromyalgia    GERD (gastroesophageal reflux disease)    Hypertension    Lupus    Thyroid disease    Patient Active Problem List   Diagnosis Date Noted   S/P laparoscopic assisted vaginal hysterectomy (LAVH) 07/25/2023   Essential hypertension, benign 02/28/2013   Other and unspecified hyperlipidemia 02/28/2013   Lupus 02/28/2013   Anxiety state 02/28/2013   Home Medication(s) Prior to Admission medications   Medication Sig Start Date End Date Taking? Authorizing Provider  acetaminophen  (TYLENOL ) 500 MG tablet Take 1,000 mg by mouth every 6 (six) hours as needed for moderate pain (pain score 4-6).    [provider]  amLODipine (NORVASC) 10 MG tablet Take 10 mg by mouth at bedtime. 01/03/18   [provider]  aspirin EC 81 MG tablet Take 81 mg by mouth daily.    [provider]  cholecalciferol (VITAMIN D3) 25 MCG (1000 UNIT) tablet Take 1,000 Units by mouth daily.    [provider]  cyclobenzaprine  (FLEXERIL ) 5 MG tablet Take 5 mg by mouth 3 (three) times daily as needed for muscle spasms.    [provider]  diphenhydrAMINE  (BENADRYL ) 25 mg capsule Take 50 mg by mouth every 6 (six) hours as needed for allergies.    [provider]  docusate sodium  (COLACE) 100 MG capsule Take 1 capsule (100 mg total) by mouth 2 (two) times daily.  07/26/23   Dillard, Naima, MD  escitalopram (LEXAPRO) 5 MG tablet Take 5 mg by mouth daily. 03/01/22   [provider]  fluticasone (FLONASE) 50 MCG/ACT nasal spray Place 1 spray into both nostrils daily as needed for allergies or rhinitis.    [provider]  gabapentin  (NEURONTIN ) 100 MG capsule Take 100 mg by mouth 3 (three) times daily as needed (pain). 08/11/15   [provider]  HYDROmorphone  (DILAUDID ) 2 MG tablet Take 0.5 tablets (1 mg total) by mouth every 6 (six) hours as needed for severe pain (pain score 7-10). 07/26/23   Marylu Soda, MD  hydrOXYzine  (ATARAX ) 25 MG tablet Take 1 tablet (25 mg total) by mouth every 6 (six) hours as needed (itching). 07/26/23   Marylu Soda, MD  losartan-hydrochlorothiazide (HYZAAR) 100-12.5 MG tablet Take 1 tablet by mouth daily. 12/20/17   [provider]  metoCLOPramide  (REGLAN ) 10 MG tablet Take 1 tablet (10 mg total) by mouth 4 (four) times daily -  before meals and at bedtime for 6 days. 07/26/23 08/01/23  Marylu Soda, MD  naproxen sodium (ANAPROX) 550 MG tablet Take 550 mg by mouth every 12 (twelve) hours as needed for moderate pain (pain score 4-6).    [provider]  ondansetron  (ZOFRAN ) 4 MG tablet Take 1 tablet (4 mg total) by mouth every 6 (six) hours as needed for nausea. 07/26/23   Marylu Soda, MD  pantoprazole  (PROTONIX ) 40 MG tablet Take 40 mg by mouth daily. 10/18/19  [provider]  Polyethyl Glycol-Propyl Glycol (SYSTANE OP) Place 1 drop into both eyes daily as needed (dry eyes).    [provider]  Potassium Chloride ER 20 MEQ TBCR Take 20 mEq by mouth daily. 02/12/22   [provider]  rosuvastatin (CRESTOR) 10 MG tablet Take 10 mg by mouth daily. 03/01/22   [provider]  SYNTHROID 75 MCG tablet Take 75 mcg by mouth daily before breakfast.  07/28/15   [provider]  traMADol  (ULTRAM ) 50 MG tablet Take 1 tablet (50 mg total) by mouth  every 6 (six) hours as needed (mild pain). 07/26/23   Marylu Soda, MD                                                                                                                                    Past Surgical History Past Surgical History:  Procedure Laterality Date   AUGMENTATION MAMMAPLASTY Bilateral    BREAST BIOPSY Left    BREAST EXCISIONAL BIOPSY Right    benign   BREAST LUMPECTOMY     x2   COLONOSCOPY  2016   CYSTOSCOPY N/A 07/25/2023   Procedure: CYSTOSCOPY;  Surgeon: Marylu Soda, MD;  Location: [facility] OR;  Service: Gynecology;  Laterality: N/A;   LAPAROSCOPIC VAGINAL HYSTERECTOMY WITH SALPINGECTOMY Bilateral 07/25/2023   Procedure: LAPAROSCOPIC ASSISTED VAGINAL HYSTERECTOMY WITH OOPHORECTOMY, SALPINGECTOMY;  Surgeon: Marylu Soda, MD;  Location: [facility] OR;  Service: Gynecology;  Laterality: Bilateral;  Gel Mats   POLYPECTOMY     Family History Family History  Problem Relation Age of Onset   Stroke Mother    Colon cancer Neg Hx    Colon polyps Neg Hx    Esophageal cancer Neg Hx    Rectal cancer Neg Hx    Stomach cancer Neg Hx     Social History Social History   Tobacco Use   Smoking status: Former    Current packs/day: 0.00    Average packs/day: 1.5 packs/day for 5.0 years (7.5 ttl pk-yrs)    Types: Cigarettes    Start date: 08/06/2009    Quit date: 08/06/2014    Years since quitting: 9.4   Smokeless tobacco: Never  Vaping Use   Vaping status: Never Used  Substance Use Topics   Alcohol use: No    Alcohol/week: 0.0 standard drinks of alcohol    Comment: quit 2010   Drug use: No   Allergies Codeine, Sulfa antibiotics, Sulfamethoxazole-trimethoprim, and Penicillins  Review of Systems Review of Systems  Gastrointestinal:  Positive for abdominal pain and nausea.    Physical Exam Vital Signs  I have reviewed the triage vital signs BP 138/71   Pulse 79   Temp 98.2 F (36.8 C) (Oral)   Resp 16   SpO2 100%   Physical Exam Vitals and nursing note  reviewed.  Constitutional:      General: She is not in acute distress.    Appearance: She  is well-developed.  HENT:     Head: Normocephalic and atraumatic.  Eyes:     Conjunctiva/sclera: Conjunctivae normal.  Cardiovascular:     Rate and Rhythm: Normal rate and regular rhythm.     Heart sounds: No murmur heard. Pulmonary:     Effort: Pulmonary effort is normal. No respiratory distress.     Breath sounds: Normal breath sounds.  Abdominal:     Palpations: Abdomen is soft.     Tenderness: There is abdominal tenderness in the right lower quadrant and left lower quadrant.  Musculoskeletal:        General: No swelling.     Cervical back: Neck supple.  Skin:    General: Skin is warm and dry.     Capillary Refill: Capillary refill takes less than 2 seconds.  Neurological:     Mental Status: She is alert.  Psychiatric:        Mood and Affect: Mood normal.     ED Results and Treatments Labs (all labs ordered are listed, but only abnormal results are displayed) Labs Reviewed  COMPREHENSIVE METABOLIC PANEL WITH GFR - Abnormal; Notable for the following components:      Result Value   Potassium 2.9 (*)    Glucose, Bld 172 (*)    Total Protein 8.4 (*)    All other components within normal limits  CBC - Abnormal; Notable for the following components:   RBC 3.82 (*)    Hemoglobin 10.4 (*)    HCT 34.4 (*)    All other components within normal limits  LIPASE, BLOOD  URINALYSIS, ROUTINE W REFLEX MICROSCOPIC                                                                                                                          Radiology CT ABDOMEN PELVIS W CONTRAST Result Date: 01/16/2024 CLINICAL DATA:  Abdominal pain EXAM: CT ABDOMEN AND PELVIS WITH CONTRAST TECHNIQUE: Multidetector CT imaging of the abdomen and pelvis was performed using the standard protocol following bolus administration of intravenous contrast. RADIATION DOSE REDUCTION: This exam was performed according to the  departmental dose-optimization program which includes automated exposure control, adjustment of the mA and/or kV according to patient size and/or use of iterative reconstruction technique. CONTRAST:  OMNIPAQUE  IOHEXOL  300 MG/ML  SOLN COMPARISON:  CT of the abdomen pelvis performed October 30, 2022 FINDINGS: Lower chest: No acute abnormality. Hepatobiliary: No focal liver abnormality is seen. No gallstones, gallbladder wall thickening, or biliary dilatation. Pancreas: Unremarkable. No pancreatic ductal dilatation or surrounding inflammatory changes. Spleen: Normal in size without focal abnormality. Adrenals/Urinary Tract: Adrenal glands are unremarkable. A right renal cyst is present measuring 1.6 cm, not significantly changed. No hydronephrosis. Urinary bladder is unremarkable. Stomach/Bowel: Stomach is within normal limits. Appendix appears normal. No evidence of bowel wall thickening, distention, or inflammatory changes. Scattered diverticulosis without evidence of diverticulitis. Vascular/Lymphatic: No significant vascular findings are present. No enlarged abdominal or pelvic lymph nodes. Reproductive: Status post hysterectomy.  No adnexal masses. Other: Fat containing umbilical hernia. Musculoskeletal: No acute or significant osseous findings. IMPRESSION: 1. No evidence of appendicitis or acute process. 2. The previous serve right renal cyst is low attenuation on the current exam which favors a benign etiology. Consider follow-up outpatient ultrasound for definitive characterization. Electronically Signed   By: Reagan Camera M.D.   On: 01/16/2024 15:07    Pertinent labs & imaging results that were available during my care of the patient were reviewed by me and considered in my medical decision making (see MDM for details).  Medications Ordered in ED Medications  HYDROcodone-acetaminophen  (NORCO/VICODIN) 5-325 MG per tablet 1 tablet (has no administration in time range)  iohexol  (OMNIPAQUE ) 300 MG/ML  solution 100 mL (100 mLs Intravenous Contrast Given 01/16/24 1351)                                                                                                                                     Procedures Procedures  (including critical care time)  Medical Decision Making / ED Course   This patient presents to the ED for concern of abdominal pain, this involves an extensive number of treatment options, and is a complaint that carries with it a high risk of complications and morbidity.  The differential diagnosis includes diverticulitis, epiploic appendagitis, colitis, gastroenteritis, constipation, nephrolithiasis, inflammatory bowel disease,   MDM: Patient seen emerged part for evaluation of abdominal pain.  Physical exam with some mild tenderness in the left lower quadrant but is otherwise unremarkable.  Laboratory evaluation with no leukocytosis, hemoglobin 10.4, potassium 2.9 but is otherwise unremarkable.  Urinalysis is negative.  CT abdomen pelvis is reassuringly unremarkable.  Symptoms improved with Bentyl.  At this time she does not meet inpatient criteria for admission and will be discharged with outpatient follow-up.  Return precautions given of which she voiced understanding she was discharged.   Additional history obtained:  -External records from outside source obtained and reviewed including: Chart review including previous notes, labs, imaging, consultation notes   Lab Tests: -I ordered, reviewed, and interpreted labs.   The pertinent results include:   Labs Reviewed  COMPREHENSIVE METABOLIC PANEL WITH GFR - Abnormal; Notable for the following components:      Result Value   Potassium 2.9 (*)    Glucose, Bld 172 (*)    Total Protein 8.4 (*)    All other components within normal limits  CBC - Abnormal; Notable for the following components:   RBC 3.82 (*)    Hemoglobin 10.4 (*)    HCT 34.4 (*)    All other components within normal limits  LIPASE, BLOOD   URINALYSIS, ROUTINE W REFLEX MICROSCOPIC      Imaging Studies ordered: I ordered imaging studies including CTAP I independently visualized and interpreted imaging. I agree with the radiologist interpretation   Medicines ordered and prescription drug management: Meds ordered this encounter  Medications   HYDROcodone-acetaminophen  (NORCO/VICODIN)  5-325 MG per tablet 1 tablet    Refill:  0   iohexol  (OMNIPAQUE ) 300 MG/ML solution 100 mL    -I have reviewed the patients home medicines and have made adjustments as needed  Critical interventions none   Cardiac Monitoring: The patient was maintained on a cardiac monitor.  I personally viewed and interpreted the cardiac monitored which showed an underlying rhythm of: NSR  Social Determinants of Health:  Factors impacting patients care include: none   Reevaluation: After the interventions noted above, I reevaluated the patient and found that they have :improved  Co morbidities that complicate the patient evaluation  Past Medical History:  Diagnosis Date   Anemia    Bell palsy    Fibromyalgia    GERD (gastroesophageal reflux disease)    Hypertension    Lupus    Thyroid disease       Dispostion: I considered admission for this patient, but at this time she does not meet inpatient criteria for admission and will be discharged with outpatient follow-up     Final Clinical Impression(s) / ED Diagnoses Final diagnoses:  None     @PCDICTATION @    Karlyn Overman, MD 01/16/24 2203

## 2024-01-16 NOTE — ED Provider Triage Note (Signed)
 Emergency Medicine Provider Triage Evaluation Note  Ariceli Stobaugh Fitzgerald , a 73 y.o. female  was evaluated in triage.  Pt complains of abdominal pain.  Started 2 weeks ago.  History of total hysterectomy in November of this past year.  Endorses nausea but no vomiting and diarrhea.  Denies urinary symptoms.  Denies abnormal vaginal bleeding.  Review of Systems  Positive: See above Negative: See above  Physical Exam  BP 135/66 (BP Location: Left Arm)   Pulse 86   Temp (!) 97.5 F (36.4 C) (Oral)   Resp 16   SpO2 99%  Gen:   Awake, no distress   Resp:  Normal effort  MSK:   Moves extremities without difficulty  Other:    Medical Decision Making  Medically screening exam initiated at 11:43 AM.  Appropriate orders placed.  Bray A Osterberg was informed that the remainder of the evaluation will be completed by another provider, this initial triage assessment does not replace that evaluation, and the importance of remaining in the ED until their evaluation is complete.  Work up started   Janalee Mcmurray, PA-C 01/16/24 1144

## 2024-01-16 NOTE — ED Triage Notes (Signed)
 Pt is here for evaluation of abdominal pain x2 weeks.  Pt describes the pain as "cramping" and pt has had nausea with this.  No urinary symptoms.  LBM was today.  Hx of hysterectomy in 2024

## 2024-02-01 ENCOUNTER — Telehealth: Payer: Self-pay

## 2024-02-01 NOTE — Telephone Encounter (Signed)
 Auth Submission: NO AUTH NEEDED Site of care: Site of care: [facility] Payer: Medicare A/B with [employer] commercial Medication & CPT/J Code(s) submitted: Feraheme (ferumoxytol) [id number] Route of submission (phone, fax, portal):  Phone # Fax # Auth type: Buy/Bill PB Units/visits requested: 510mg  x 2 doses Reference number:  Approval from: 02/01/24 to 10/06/24   Medicare A/B will only cover Feraheme and [employer] will not cover Feraheme until the patient has tried and failed Venofer.

## 2024-02-03 ENCOUNTER — Other Ambulatory Visit (HOSPITAL_COMMUNITY): Payer: Self-pay | Admitting: *Deleted

## 2024-02-06 ENCOUNTER — Ambulatory Visit (HOSPITAL_COMMUNITY)
Admission: RE | Admit: 2024-02-06 | Discharge: 2024-02-06 | Disposition: A | Discharge status: Home / Self Care | Source: Ambulatory Visit | Attending: Internal Medicine | Admitting: Internal Medicine

## 2024-02-06 ENCOUNTER — Encounter: Payer: Self-pay | Admitting: Internal Medicine

## 2024-02-06 DIAGNOSIS — D649 Anemia, unspecified: Secondary | ICD-10-CM | POA: Diagnosis present

## 2024-02-06 MED ORDER — SODIUM CHLORIDE 0.9 % IV SOLN
510.0000 mg | INTRAVENOUS | Status: DC
  Administered 2024-02-06: 510 mg via INTRAVENOUS
  Filled 2024-02-06: qty 510

## 2024-02-13 ENCOUNTER — Ambulatory Visit (HOSPITAL_COMMUNITY)
Admission: RE | Admit: 2024-02-13 | Discharge: 2024-02-13 | Disposition: A | Discharge status: Home / Self Care | Source: Ambulatory Visit | Attending: Internal Medicine | Admitting: Internal Medicine

## 2024-02-13 DIAGNOSIS — D509 Iron deficiency anemia, unspecified: Secondary | ICD-10-CM | POA: Insufficient documentation

## 2024-02-13 MED ORDER — SODIUM CHLORIDE 0.9 % IV SOLN
510.0000 mg | INTRAVENOUS | Status: DC
  Administered 2024-02-13: 510 mg via INTRAVENOUS
  Filled 2024-02-13: qty 510

## 2024-02-15 ENCOUNTER — Encounter: Payer: Self-pay | Admitting: Physician Assistant

## 2024-03-28 ENCOUNTER — Encounter: Payer: Self-pay | Admitting: Physician Assistant

## 2024-03-28 ENCOUNTER — Other Ambulatory Visit (INDEPENDENT_AMBULATORY_CARE_PROVIDER_SITE_OTHER)

## 2024-03-28 ENCOUNTER — Ambulatory Visit (INDEPENDENT_AMBULATORY_CARE_PROVIDER_SITE_OTHER): Admitting: Physician Assistant

## 2024-03-28 VITALS — BP 124/68 | HR 70 | Ht 62.0 in | Wt 195.2 lb

## 2024-03-28 DIAGNOSIS — R109 Unspecified abdominal pain: Secondary | ICD-10-CM

## 2024-03-28 DIAGNOSIS — R1013 Epigastric pain: Secondary | ICD-10-CM

## 2024-03-28 DIAGNOSIS — D509 Iron deficiency anemia, unspecified: Secondary | ICD-10-CM

## 2024-03-28 DIAGNOSIS — R1030 Lower abdominal pain, unspecified: Secondary | ICD-10-CM

## 2024-03-28 DIAGNOSIS — E876 Hypokalemia: Secondary | ICD-10-CM

## 2024-03-28 DIAGNOSIS — R110 Nausea: Secondary | ICD-10-CM | POA: Diagnosis not present

## 2024-03-28 DIAGNOSIS — R1084 Generalized abdominal pain: Secondary | ICD-10-CM

## 2024-03-28 DIAGNOSIS — D649 Anemia, unspecified: Secondary | ICD-10-CM

## 2024-03-28 DIAGNOSIS — Z860101 Personal history of adenomatous and serrated colon polyps: Secondary | ICD-10-CM

## 2024-03-28 LAB — CBC WITH DIFFERENTIAL/PLATELET
Basophils Absolute: 0 K/uL (ref 0.0–0.1)
Basophils Relative: 0.6 % (ref 0.0–3.0)
Eosinophils Absolute: 0.3 K/uL (ref 0.0–0.7)
Eosinophils Relative: 5.5 % — ABNORMAL HIGH (ref 0.0–5.0)
HCT: 37.4 % (ref 36.0–46.0)
Hemoglobin: 12.1 g/dL (ref 12.0–15.0)
Lymphocytes Relative: 36 % (ref 12.0–46.0)
Lymphs Abs: 1.7 K/uL (ref 0.7–4.0)
MCHC: 32.4 g/dL (ref 30.0–36.0)
MCV: 89.2 fl (ref 78.0–100.0)
Monocytes Absolute: 0.4 K/uL (ref 0.1–1.0)
Monocytes Relative: 9.2 % (ref 3.0–12.0)
Neutro Abs: 2.4 K/uL (ref 1.4–7.7)
Neutrophils Relative %: 48.7 % (ref 43.0–77.0)
Platelets: 315 K/uL (ref 150.0–400.0)
RBC: 4.2 Mil/uL (ref 3.87–5.11)
RDW: 16.7 % — ABNORMAL HIGH (ref 11.5–15.5)
WBC: 4.9 K/uL (ref 4.0–10.5)

## 2024-03-28 LAB — BASIC METABOLIC PANEL WITH GFR
BUN: 8 mg/dL (ref 6–23)
CO2: 28 meq/L (ref 19–32)
Calcium: 9.7 mg/dL (ref 8.4–10.5)
Chloride: 101 meq/L (ref 96–112)
Creatinine, Ser: 0.82 mg/dL (ref 0.40–1.20)
GFR: 71.79 mL/min (ref >60.00)
Glucose, Bld: 96 mg/dL (ref 70–99)
Potassium: 3.4 meq/L — ABNORMAL LOW (ref 3.5–5.1)
Sodium: 139 meq/L (ref 135–145)

## 2024-03-28 LAB — B12 AND FOLATE PANEL
Folate: 4.7 ng/mL — ABNORMAL LOW (ref >5.9)
Vitamin B-12: 947 pg/mL — ABNORMAL HIGH (ref 211–911)

## 2024-03-28 NOTE — Progress Notes (Signed)
 Ann Console, PA-C [address] [place], [place]  [address] Phone: [phone]   Gastroenterology Consultation  Referring Provider:     Larnell Hamilton, MD Primary Care Physician:  Larnell Hamilton, MD Primary Gastroenterologist:  Ann Console, PA-C / Norleen Kiang, MD  Reason for Consultation:     Abdominal cramping        HPI:   Ann Rodgers is a 73 y.o. y/o female, established patient Dr. Kiang, is referred for consultation & management  by Larnell Hamilton, MD. here to evaluate abdominal cramping.  Medical history of fibromyalgia, GERD, HTN, lupus.  History of anemia.  Recently received Feraheme.  Takes Protonix  40 Mg daily, MiraLAX as needed, stool softener as needed.  Current symptoms: Patient has had bilateral lower abdominal cramping intermittently for over a year.  She has been followed by OB/GYN to evaluate lower abdominal and pelvic pain.  Underwent laparoscopic vaginal hysterectomy 05/2023.  Lower abdominal pain improved for a few months.  She has had recurrent lower abdominal cramping for 6 to 8 weeks.  Currently taking dicyclomine  20 Mg 3 times daily which helps.  She denies adverse side effects of dicyclomine .  Also takes Tylenol  and ibuprofen  as needed.  Daily ibuprofen  for arthritis.   She has also noticed some epigastric discomfort, nausea, and dark stools.  No previous EGD.  Denies bright red rectal bleeding or unintentional weight loss.  She receives IV iron infusions for iron deficiency anemia.  Her grandmother died from stomach cancer.  Bowel movements are pretty regular.  Occasional mild loose stool.  No constipation.  01/2020 colonoscopy by Dr. Kiang: 2 small (2 mm to 4 mm) polyps removed.  Excellent prep.  5-year repeat (due 01/2025).  08/2015 colonoscopy: 5 small (3 mm to 5 mm) adenomatous polyps removed.  No previous EGD.  01/16/2024: Low hemoglobin 10.4, MCV 90.  Normal WBC 6.4.  Low potassium 2.9.  Glucose 172.  Normal LFTs and renal function. Normal  lipase.  01/16/2024 CT abdomen pelvis with contrast: 1. No evidence of appendicitis or acute process. 2. The previous serve right renal cyst is low attenuation on the current exam which favors a benign etiology. Consider follow-up outpatient ultrasound for definitive characterization.  10/2022 CT abdomen pelvis with contrast: No bowel obstruction, free air or free fluid. Few colonic diverticula. Normal appendix. 2.  Small fat containing umbilical hernia. 3.  There is a prominent lymph node in the anterior cardiophrenic space, larger than usually seen but nonspecific. Simple attention on follow-up. 4.  Mildly hyperdense right-sided renal lesion measuring 12 mm. This could be a complex or proteinaceous cyst but is indeterminate. Recommend a follow up ultrasound of the right kidney versus a noncontrast study after clearance of the contrast to further Delineate.  Past Medical History:  Diagnosis Date   Anemia    Bell palsy    Colon polyps    Fibromyalgia    GERD (gastroesophageal reflux disease)    Hypertension    Kidney stones    Lupus    Obesity    Thyroid disease     Past Surgical History:  Procedure Laterality Date   AUGMENTATION MAMMAPLASTY Bilateral    BREAST BIOPSY Left    BREAST EXCISIONAL BIOPSY Right    benign   BREAST LUMPECTOMY     x2   COLONOSCOPY  2016   CYSTOSCOPY N/A 07/25/2023   Procedure: CYSTOSCOPY;  Surgeon: Armond Cape, MD;  Location: [facility];  Service: Gynecology;  Laterality: N/A;  LAPAROSCOPIC VAGINAL HYSTERECTOMY WITH SALPINGECTOMY Bilateral 07/25/2023   Procedure: LAPAROSCOPIC ASSISTED VAGINAL HYSTERECTOMY WITH OOPHORECTOMY, SALPINGECTOMY;  Surgeon: Armond Cape, MD;  Location: [facility];  Service: Gynecology;  Laterality: Bilateral;  Gel Mats   POLYPECTOMY      Prior to Admission medications   Medication Sig Start Date End Date Taking? Authorizing Provider  acetaminophen  (TYLENOL ) 500 MG tablet Take 1,000 mg by mouth every 6 (six) hours as needed  for moderate pain (pain score 4-6).    [provider]  amLODipine (NORVASC) 10 MG tablet Take 10 mg by mouth at bedtime. 01/03/18   [provider]  aspirin EC 81 MG tablet Take 81 mg by mouth daily.    [provider]  cholecalciferol (VITAMIN D3) 25 MCG (1000 UNIT) tablet Take 1,000 Units by mouth daily.    [provider]  dicyclomine  (BENTYL ) 20 MG tablet Take 1 tablet (20 mg total) by mouth 2 (two) times daily. 01/16/24   Kommor, Madison, MD  docusate sodium  (COLACE) 100 MG capsule Take 1 capsule (100 mg total) by mouth 2 (two) times daily. Patient not taking: Reported on 01/16/2024 07/26/23   Dillard, Naima, MD  escitalopram (LEXAPRO) 5 MG tablet Take 5 mg by mouth daily. 03/01/22   [provider]  gabapentin  (NEURONTIN ) 300 MG capsule Take 600 mg by mouth at bedtime.    [provider]  HYDROmorphone  (DILAUDID ) 2 MG tablet Take 0.5 tablets (1 mg total) by mouth every 6 (six) hours as needed for severe pain (pain score 7-10). Patient not taking: Reported on 01/16/2024 07/26/23   Armond Cape, MD  hydrOXYzine  (ATARAX ) 25 MG tablet Take 1 tablet (25 mg total) by mouth every 6 (six) hours as needed (itching). Patient not taking: Reported on 01/16/2024 07/26/23   Armond Cape, MD  ibuprofen  (ADVIL ) 800 MG tablet Take 800 mg by mouth every 8 (eight) hours as needed for moderate pain (pain score 4-6).    [provider]  losartan-hydrochlorothiazide (HYZAAR) 100-12.5 MG tablet Take 1 tablet by mouth daily. 12/20/17   [provider]  metoCLOPramide  (REGLAN ) 10 MG tablet Take 1 tablet (10 mg total) by mouth 4 (four) times daily -  before meals and at bedtime for 6 days. Patient not taking: Reported on 01/16/2024 07/26/23 08/01/23  Armond Cape, MD  ondansetron  (ZOFRAN ) 4 MG tablet Take 1 tablet (4 mg total) by mouth every 6 (six) hours as needed for nausea. Patient not taking: Reported on 01/16/2024 07/26/23   Armond Cape, MD   pantoprazole  (PROTONIX ) 40 MG tablet Take 40 mg by mouth daily. 10/18/19   [provider]  Polyethyl Glycol-Propyl Glycol (SYSTANE OP) Place 1 drop into both eyes daily as needed (dry eyes).    [provider]  Potassium Chloride ER 20 MEQ TBCR Take 20 mEq by mouth daily. 02/12/22   [provider]  rosuvastatin (CRESTOR) 10 MG tablet Take 10 mg by mouth daily. 03/01/22   [provider]  SYNTHROID 75 MCG tablet Take 75 mcg by mouth daily before breakfast.  07/28/15   [provider]  traMADol  (ULTRAM ) 50 MG tablet Take 1 tablet (50 mg total) by mouth every 6 (six) hours as needed (mild pain). Patient not taking: Reported on 01/16/2024 07/26/23   Armond Cape, MD    Family History  Problem Relation Age of Onset   Stroke Mother    Colon cancer Neg Hx    Colon polyps Neg Hx    Esophageal cancer Neg Hx  Rectal cancer Neg Hx    Stomach cancer Neg Hx      Social History   Tobacco Use   Smoking status: Former    Current packs/day: 0.00    Average packs/day: 1.5 packs/day for 5.0 years (7.5 ttl pk-yrs)    Types: Cigarettes    Start date: 08/06/2009    Quit date: 08/06/2014    Years since quitting: 9.6   Smokeless tobacco: Never  Vaping Use   Vaping status: Never Used  Substance Use Topics   Alcohol use: No    Alcohol/week: 0.0 standard drinks of alcohol    Comment: quit 2010   Drug use: No    Allergies as of 03/28/2024 - Review Complete 03/28/2024  Allergen Reaction Noted   Codeine  02/28/2013   Sulfa antibiotics Nausea And Vomiting 02/28/2013   Sulfamethoxazole-trimethoprim Nausea And Vomiting 12/25/2019   Penicillins Rash 01/19/2018    Review of Systems:    All systems reviewed and negative except where noted in HPI.   Physical Exam:  BP 124/68 (BP Location: Left Arm, Patient Position: Sitting, Cuff Size: Normal)   Pulse 70   Ht 5' 2 (1.575 m)   Wt 195 lb 4 oz (88.6 kg)   BMI 35.71 kg/m  No LMP recorded. Patient is  postmenopausal.  General:   Alert,  Well-developed, well-nourished, pleasant and cooperative in NAD Lungs:  Respirations even and unlabored.  Clear throughout to auscultation.   No wheezes, crackles, or rhonchi. No acute distress. Heart:  Regular rate and rhythm; no murmurs, clicks, rubs, or gallops. Abdomen:  Normal bowel sounds.  No bruits.  Soft, and non-distended without masses, hepatosplenomegaly or hernias noted.  No Tenderness.  No guarding or rebound tenderness.    Neurologic:  Alert and oriented x3;  grossly normal neurologically. Psych:  Alert and cooperative. Normal mood and affect.  Imaging Studies: No results found.  Labs: CBC    Component Value Date/Time   WBC 4.9 03/28/2024 1517   RBC 4.20 03/28/2024 1517   HGB 12.1 03/28/2024 1517   HCT 37.4 03/28/2024 1517   PLT 315.0 03/28/2024 1517   MCV 89.2 03/28/2024 1517   MCH 27.2 01/16/2024 1151   MCHC 32.4 03/28/2024 1517   RDW 16.7 (H) 03/28/2024 1517   LYMPHSABS 1.7 03/28/2024 1517   MONOABS 0.4 03/28/2024 1517   EOSABS 0.3 03/28/2024 1517   BASOSABS 0.0 03/28/2024 1517    CMP     Component Value Date/Time   NA 139 03/28/2024 1517   K 3.4 (L) 03/28/2024 1517   CL 101 03/28/2024 1517   CO2 28 03/28/2024 1517   GLUCOSE 96 03/28/2024 1517   BUN 8 03/28/2024 1517   CREATININE 0.82 03/28/2024 1517   CALCIUM 9.7 03/28/2024 1517   PROT 8.4 (H) 01/16/2024 1151   ALBUMIN  3.9 01/16/2024 1151   AST 29 01/16/2024 1151   ALT 17 01/16/2024 1151   ALKPHOS 111 01/16/2024 1151   BILITOT 0.6 01/16/2024 1151   GFRNONAA >60 01/16/2024 1151    Assessment and Plan:   Ann Rodgers is a 73 y.o. y/o female has been referred for   1.  Lower abdominal cramping: relieved with Dicyclomine .  Most consistent with colon spasm. Recent abdominal pelvic CT with contrast showed no acute abnormality. - Continue dicyclomine  20 Mg 3 times daily. - She denies adverse side effects to dicyclomine .  Recommend lowest effective dose  necessary.  2. Epigastric pain, Nausea, dark stool, Gma with stomach CA.  History of NSAID use.  Evaluate for gastritis or peptic ulcer. - Scheduling EGD I discussed risks of EGD with patient to include risk of bleeding, perforation, and risk of sedation.  Patient expressed understanding and agrees to proceed with EGD.  - Encouraged her to stop ibuprofen  and avoid NSAIDs.  2.  Anemia - Recently received IV Iron. - Labs: CBC, iron panel, ferritin, B12, folate, and celiac lab.  - Schedule EGD to evaluate source of iron deficiency anemia.  3.  Hypokalemia - BMP  4.  History of adenomatous colon polyps - 5-year repeat colonoscopy will be due 01/2025.  Follow up 4 weeks after EGD with TG.  Ann Console, PA-C

## 2024-03-28 NOTE — Patient Instructions (Signed)
 Your provider has requested that you go to the basement level for lab work before leaving today. Press B on the elevator. The lab is located at the first door on the left as you exit the elevator.  Please follow up sooner if symptoms increase or worsen  Due to recent changes in healthcare laws, you may see the results of your imaging and laboratory studies on MyChart before your provider has had a chance to review them.  We understand that in some cases there may be results that are confusing or concerning to you. Not all laboratory results come back in the same time frame and the provider may be waiting for multiple results in order to interpret others.  Please give us  48 hours in order for your provider to thoroughly review all the results before contacting the office for clarification of your results.   Thank you for trusting me with your gastrointestinal care!   Ellouise Console, PA-C _______________________________________________________  If your blood pressure at your visit was 140/90 or greater, please contact your primary care physician to follow up on this.  _______________________________________________________  If you are age 95 or older, your body mass index should be between 23-30. Your Body mass index is 35.71 kg/m. If this is out of the aforementioned range listed, please consider follow up with your Primary Care Provider.  If you are age 33 or younger, your body mass index should be between 19-25. Your Body mass index is 35.71 kg/m. If this is out of the aformentioned range listed, please consider follow up with your Primary Care Provider.   ________________________________________________________  The [facility] providers would like to encourage you to use MYCHART to communicate with providers for non-urgent requests or questions.  Due to long hold times on the telephone, sending your provider a message by [facility] may be a faster and more efficient way to get a response.   Please allow 48 business hours for a response.  Please remember that this is for non-urgent requests.  _______________________________________________________

## 2024-03-29 ENCOUNTER — Ambulatory Visit: Payer: Self-pay | Admitting: Physician Assistant

## 2024-03-29 DIAGNOSIS — E538 Deficiency of other specified B group vitamins: Secondary | ICD-10-CM

## 2024-03-29 LAB — IRON,TIBC AND FERRITIN PANEL
%SAT: 25 % (ref 16–45)
Ferritin: 268 ng/mL (ref 16–288)
Iron: 81 ug/dL (ref 45–160)
TIBC: 327 ug/dL (ref 250–450)

## 2024-03-29 NOTE — Progress Notes (Signed)
 Noted

## 2024-04-02 ENCOUNTER — Other Ambulatory Visit: Payer: Self-pay

## 2024-04-02 ENCOUNTER — Ambulatory Visit: Admitting: Internal Medicine

## 2024-04-02 MED ORDER — SUCRALFATE 1 G PO TABS: 1.0000 g | ORAL_TABLET | Freq: Three times a day (TID) | ORAL | 1 refills | Status: DC

## 2024-04-04 LAB — CELIAC DISEASE AB SCREEN W/RFX
Antigliadin Abs, IgA: 5 U (ref 0–19)
IgA/Immunoglobulin A, Serum: 321 mg/dL (ref 64–422)
Transglutaminase IgA: <2 U/mL (ref 0–3)

## 2024-04-17 ENCOUNTER — Ambulatory Visit: Admitting: Internal Medicine

## 2024-04-17 ENCOUNTER — Encounter: Payer: Self-pay | Admitting: Internal Medicine

## 2024-04-17 VITALS — BP 119/72 | HR 65 | Temp 97.1°F | Resp 17 | Ht 62.0 in | Wt 195.0 lb

## 2024-04-17 DIAGNOSIS — K317 Polyp of stomach and duodenum: Secondary | ICD-10-CM | POA: Diagnosis not present

## 2024-04-17 DIAGNOSIS — K3189 Other diseases of stomach and duodenum: Secondary | ICD-10-CM

## 2024-04-17 DIAGNOSIS — K219 Gastro-esophageal reflux disease without esophagitis: Secondary | ICD-10-CM | POA: Diagnosis not present

## 2024-04-17 DIAGNOSIS — D509 Iron deficiency anemia, unspecified: Secondary | ICD-10-CM

## 2024-04-17 DIAGNOSIS — R1084 Generalized abdominal pain: Secondary | ICD-10-CM

## 2024-04-17 DIAGNOSIS — E538 Deficiency of other specified B group vitamins: Secondary | ICD-10-CM

## 2024-04-17 DIAGNOSIS — K222 Esophageal obstruction: Secondary | ICD-10-CM

## 2024-04-17 DIAGNOSIS — R1013 Epigastric pain: Secondary | ICD-10-CM

## 2024-04-17 DIAGNOSIS — K449 Diaphragmatic hernia without obstruction or gangrene: Secondary | ICD-10-CM

## 2024-04-17 DIAGNOSIS — E876 Hypokalemia: Secondary | ICD-10-CM

## 2024-04-17 DIAGNOSIS — R109 Unspecified abdominal pain: Secondary | ICD-10-CM

## 2024-04-17 MED ORDER — SODIUM CHLORIDE 0.9 % IV SOLN: 500.0000 mL | Freq: Once | INTRAVENOUS | Status: DC

## 2024-04-17 NOTE — Progress Notes (Signed)
 Updated medical record w/ pt

## 2024-04-17 NOTE — Patient Instructions (Signed)
 Resume previous diet  Continue present medications  Await pathology results  See handout on hiatal hernia  YOU HAD AN ENDOSCOPIC PROCEDURE TODAY AT THE [facility]:   Refer to the procedure report that was given to you for any specific questions about what was found during the examination.  If the procedure report does not answer your questions, please call your gastroenterologist to clarify.  If you requested that your care partner not be given the details of your procedure findings, then the procedure report has been included in a sealed envelope for you to review at your convenience later.  YOU SHOULD EXPECT: Some feelings of bloating in the abdomen. Passage of more gas than usual.  Walking can help get rid of the air that was put into your GI tract during the procedure and reduce the bloating. If you had a lower endoscopy (such as a colonoscopy or flexible sigmoidoscopy) you may notice spotting of blood in your stool or on the toilet paper. If you underwent a bowel prep for your procedure, you may not have a normal bowel movement for a few days.  Please Note:  You might notice some irritation and congestion in your nose or some drainage.  This is from the oxygen used during your procedure.  There is no need for concern and it should clear up in a day or so.  SYMPTOMS TO REPORT IMMEDIATELY: Following upper endoscopy (EGD)  Vomiting of blood or coffee ground material  New chest pain or pain under the shoulder blades  Painful or persistently difficult swallowing  New shortness of breath  Fever of 100F or higher  Black, tarry-looking stools  For urgent or emergent issues, a gastroenterologist can be reached at any hour by calling [phone]. Do not use MyChart messaging for urgent concerns.   DIET:  We do recommend a small meal at first, but then you may proceed to your regular diet.  Drink plenty of fluids but you should avoid alcoholic beverages for 24 hours.  ACTIVITY:   You should plan to take it easy for the rest of today and you should NOT DRIVE or use heavy machinery until tomorrow (because of the sedation medicines used during the test).    FOLLOW UP: Our staff will call the number listed on your records the next business day following your procedure.  We will call around 7:15- 8:00 am to check on you and address any questions or concerns that you may have regarding the information given to you following your procedure. If we do not reach you, we will leave a message.     If any biopsies were taken you will be contacted by phone or by letter within the next 1-3 weeks.  Please call us  at [phone] if you have not heard about the biopsies in 3 weeks.   SIGNATURES/CONFIDENTIALITY: You and/or your care partner have signed paperwork which will be entered into your electronic medical record.  These signatures attest to the fact that that the information above on your After Visit Summary has been reviewed and is understood.  Full responsibility of the confidentiality of this discharge information lies with you and/or your care-partner.

## 2024-04-17 NOTE — Progress Notes (Signed)
 Expand All Collapse All       Ellouise Console, PA-C [address] [place], [place]  [address] Phone: [phone]   Gastroenterology Consultation   Referring Provider:     Larnell Hamilton, MD Primary Care Physician:  Larnell Hamilton, MD Primary Gastroenterologist:  Ellouise Console, PA-C / Norleen Kiang, MD  Reason for Consultation:     Abdominal cramping        HPI:   Chelci Wintermute Win is a 73 y.o. y/o female, established patient Dr. Kiang, is referred for consultation & management  by Larnell Hamilton, MD. here to evaluate abdominal cramping.  Medical history of fibromyalgia, GERD, HTN, lupus.  History of anemia.  Recently received Feraheme.  Takes Protonix  40 Mg daily, MiraLAX as needed, stool softener as needed.   Current symptoms: Patient has had bilateral lower abdominal cramping intermittently for over a year.  She has been followed by OB/GYN to evaluate lower abdominal and pelvic pain.  Underwent laparoscopic vaginal hysterectomy 05/2023.  Lower abdominal pain improved for a few months.  She has had recurrent lower abdominal cramping for 6 to 8 weeks.  Currently taking dicyclomine  20 Mg 3 times daily which helps.  She denies adverse side effects of dicyclomine .  Also takes Tylenol  and ibuprofen  as needed.  Daily ibuprofen  for arthritis.   She has also noticed some epigastric discomfort, nausea, and dark stools.  No previous EGD.  Denies bright red rectal bleeding or unintentional weight loss.  She receives IV iron infusions for iron deficiency anemia.  Her grandmother died from stomach cancer.  Bowel movements are pretty regular.  Occasional mild loose stool.  No constipation.   01/2020 colonoscopy by Dr. Kiang: 2 small (2 mm to 4 mm) polyps removed.  Excellent prep.  5-year repeat (due 01/2025).   08/2015 colonoscopy: 5 small (3 mm to 5 mm) adenomatous polyps removed.   No previous EGD.   01/16/2024: Low hemoglobin 10.4, MCV 90.  Normal WBC 6.4.  Low potassium 2.9.  Glucose 172.  Normal LFTs  and renal function. Normal lipase.   01/16/2024 CT abdomen pelvis with contrast: 1. No evidence of appendicitis or acute process. 2. The previous serve right renal cyst is low attenuation on the current exam which favors a benign etiology. Consider follow-up outpatient ultrasound for definitive characterization.   10/2022 CT abdomen pelvis with contrast: No bowel obstruction, free air or free fluid. Few colonic diverticula. Normal appendix. 2.  Small fat containing umbilical hernia. 3.  There is a prominent lymph node in the anterior cardiophrenic space, larger than usually seen but nonspecific. Simple attention on follow-up. 4.  Mildly hyperdense right-sided renal lesion measuring 12 mm. This could be a complex or proteinaceous cyst but is indeterminate. Recommend a follow up ultrasound of the right kidney versus a noncontrast study after clearance of the contrast to further Delineate.       Past Medical History:  Diagnosis Date   Anemia     Bell palsy     Colon polyps     Fibromyalgia     GERD (gastroesophageal reflux disease)     Hypertension     Kidney stones     Lupus     Obesity     Thyroid disease                 Past Surgical History:  Procedure Laterality Date   AUGMENTATION MAMMAPLASTY Bilateral     BREAST BIOPSY Left     BREAST EXCISIONAL BIOPSY Right  benign   BREAST LUMPECTOMY        x2   COLONOSCOPY   2016   CYSTOSCOPY N/A 07/25/2023    Procedure: CYSTOSCOPY;  Surgeon: Armond Cape, MD;  Location: [facility] OR;  Service: Gynecology;  Laterality: N/A;   LAPAROSCOPIC VAGINAL HYSTERECTOMY WITH SALPINGECTOMY Bilateral 07/25/2023    Procedure: LAPAROSCOPIC ASSISTED VAGINAL HYSTERECTOMY WITH OOPHORECTOMY, SALPINGECTOMY;  Surgeon: Armond Cape, MD;  Location: [facility] OR;  Service: Gynecology;  Laterality: Bilateral;  Gel Mats   POLYPECTOMY                     Prior to Admission medications   Medication Sig Start Date End Date Taking? Authorizing Provider   acetaminophen  (TYLENOL ) 500 MG tablet Take 1,000 mg by mouth every 6 (six) hours as needed for moderate pain (pain score 4-6).       [provider]  amLODipine (NORVASC) 10 MG tablet Take 10 mg by mouth at bedtime. 01/03/18     [provider]  aspirin EC 81 MG tablet Take 81 mg by mouth daily.       [provider]  cholecalciferol (VITAMIN D3) 25 MCG (1000 UNIT) tablet Take 1,000 Units by mouth daily.       [provider]  dicyclomine  (BENTYL ) 20 MG tablet Take 1 tablet (20 mg total) by mouth 2 (two) times daily. 01/16/24     Kommor, Madison, MD  docusate sodium  (COLACE) 100 MG capsule Take 1 capsule (100 mg total) by mouth 2 (two) times daily. Patient not taking: Reported on 01/16/2024 07/26/23     Dillard, Naima, MD  escitalopram (LEXAPRO) 5 MG tablet Take 5 mg by mouth daily. 03/01/22     [provider]  gabapentin  (NEURONTIN ) 300 MG capsule Take 600 mg by mouth at bedtime.       [provider]  HYDROmorphone  (DILAUDID ) 2 MG tablet Take 0.5 tablets (1 mg total) by mouth every 6 (six) hours as needed for severe pain (pain score 7-10). Patient not taking: Reported on 01/16/2024 07/26/23     Armond Cape, MD  hydrOXYzine  (ATARAX ) 25 MG tablet Take 1 tablet (25 mg total) by mouth every 6 (six) hours as needed (itching). Patient not taking: Reported on 01/16/2024 07/26/23     Armond Cape, MD  ibuprofen  (ADVIL ) 800 MG tablet Take 800 mg by mouth every 8 (eight) hours as needed for moderate pain (pain score 4-6).       [provider]  losartan-hydrochlorothiazide (HYZAAR) 100-12.5 MG tablet Take 1 tablet by mouth daily. 12/20/17     [provider]  metoCLOPramide  (REGLAN ) 10 MG tablet Take 1 tablet (10 mg total) by mouth 4 (four) times daily -  before meals and at bedtime for 6 days. Patient not taking: Reported on 01/16/2024 07/26/23 08/01/23   Armond Cape, MD  ondansetron  (ZOFRAN ) 4 MG tablet Take 1 tablet (4 mg total)  by mouth every 6 (six) hours as needed for nausea. Patient not taking: Reported on 01/16/2024 07/26/23     Armond Cape, MD  pantoprazole  (PROTONIX ) 40 MG tablet Take 40 mg by mouth daily. 10/18/19     [provider]  Polyethyl Glycol-Propyl Glycol (SYSTANE OP) Place 1 drop into both eyes daily as needed (dry eyes).       [provider]  Potassium Chloride ER 20 MEQ TBCR Take 20 mEq by mouth daily. 02/12/22     [provider]  rosuvastatin (CRESTOR) 10 MG tablet Take 10  mg by mouth daily. 03/01/22     [provider]  SYNTHROID 75 MCG tablet Take 75 mcg by mouth daily before breakfast.  07/28/15     [provider]  traMADol  (ULTRAM ) 50 MG tablet Take 1 tablet (50 mg total) by mouth every 6 (six) hours as needed (mild pain). Patient not taking: Reported on 01/16/2024 07/26/23     Armond Cape, MD           Family History  Problem Relation Age of Onset   Stroke Mother     Colon cancer Neg Hx     Colon polyps Neg Hx     Esophageal cancer Neg Hx     Rectal cancer Neg Hx     Stomach cancer Neg Hx            Social History  Social History         Tobacco Use   Smoking status: Former      Current packs/day: 0.00      Average packs/day: 1.5 packs/day for 5.0 years (7.5 ttl pk-yrs)      Types: Cigarettes      Start date: 08/06/2009      Quit date: 08/06/2014      Years since quitting: 9.6   Smokeless tobacco: Never  Vaping Use   Vaping status: Never Used  Substance Use Topics   Alcohol use: No      Alcohol/week: 0.0 standard drinks of alcohol      Comment: quit 2010   Drug use: No             Allergies as of 03/28/2024 - Review Complete 03/28/2024  Allergen Reaction Noted   Codeine   02/28/2013   Sulfa antibiotics Nausea And Vomiting 02/28/2013   Sulfamethoxazole-trimethoprim Nausea And Vomiting 12/25/2019   Penicillins Rash 01/19/2018      Review of Systems:    All systems reviewed and negative except where noted in HPI.     Physical Exam:  BP 124/68 (BP Location: Left Arm, Patient Position: Sitting, Cuff Size: Normal)   Pulse 70   Ht 5' 2 (1.575 m)   Wt 195 lb 4 oz (88.6 kg)   BMI 35.71 kg/m  No LMP recorded. Patient is postmenopausal.   General:   Alert,  Well-developed, well-nourished, pleasant and cooperative in NAD Lungs:  Respirations even and unlabored.  Clear throughout to auscultation.   No wheezes, crackles, or rhonchi. No acute distress. Heart:  Regular rate and rhythm; no murmurs, clicks, rubs, or gallops. Abdomen:  Normal bowel sounds.  No bruits.  Soft, and non-distended without masses, hepatosplenomegaly or hernias noted.  No Tenderness.  No guarding or rebound tenderness.    Neurologic:  Alert and oriented x3;  grossly normal neurologically. Psych:  Alert and cooperative. Normal mood and affect.   Imaging Studies: Imaging Results  No results found.     Labs: CBC Labs (Brief)          Component Value Date/Time    WBC 4.9 03/28/2024 1517    RBC 4.20 03/28/2024 1517    HGB 12.1 03/28/2024 1517    HCT 37.4 03/28/2024 1517    PLT 315.0 03/28/2024 1517    MCV 89.2 03/28/2024 1517    MCH 27.2 01/16/2024 1151    MCHC 32.4 03/28/2024 1517    RDW 16.7 (H) 03/28/2024 1517    LYMPHSABS 1.7 03/28/2024 1517    MONOABS 0.4 03/28/2024 1517    EOSABS 0.3 03/28/2024 1517  BASOSABS 0.0 03/28/2024 1517        CMP     Labs (Brief)          Component Value Date/Time    NA 139 03/28/2024 1517    K 3.4 (L) 03/28/2024 1517    CL 101 03/28/2024 1517    CO2 28 03/28/2024 1517    GLUCOSE 96 03/28/2024 1517    BUN 8 03/28/2024 1517    CREATININE 0.82 03/28/2024 1517    CALCIUM 9.7 03/28/2024 1517    PROT 8.4 (H) 01/16/2024 1151    ALBUMIN  3.9 01/16/2024 1151    AST 29 01/16/2024 1151    ALT 17 01/16/2024 1151    ALKPHOS 111 01/16/2024 1151    BILITOT 0.6 01/16/2024 1151    GFRNONAA >60 01/16/2024 1151        Assessment and Plan:    Raschelle Wisenbaker Bubar is a 73 y.o. y/o female has  been referred for    1.  Lower abdominal cramping: relieved with Dicyclomine .  Most consistent with colon spasm. Recent abdominal pelvic CT with contrast showed no acute abnormality. - Continue dicyclomine  20 Mg 3 times daily. - She denies adverse side effects to dicyclomine .  Recommend lowest effective dose necessary.   2. Epigastric pain, Nausea, dark stool, Gma with stomach CA.  History of NSAID use.  Evaluate for gastritis or peptic ulcer. - Scheduling EGD I discussed risks of EGD with patient to include risk of bleeding, perforation, and risk of sedation.  Patient expressed understanding and agrees to proceed with EGD.  - Encouraged her to stop ibuprofen  and avoid NSAIDs.   2.  Anemia - Recently received IV Iron. - Labs: CBC, iron panel, ferritin, B12, folate, and celiac lab.  - Schedule EGD to evaluate source of iron deficiency anemia.   3.  Hypokalemia - BMP   4.  History of adenomatous colon polyps - 5-year repeat colonoscopy will be due 01/2025.   Follow up 4 weeks after EGD with TG.   Ellouise Console, PA-C

## 2024-04-17 NOTE — Progress Notes (Signed)
 Sedate, gd SR, tolerated procedure well, VSS, report to RN

## 2024-04-17 NOTE — Progress Notes (Signed)
 Called to room to assist during endoscopic procedure.  Patient ID and intended procedure confirmed with present staff. Received instructions for my participation in the procedure from the performing physician.

## 2024-04-17 NOTE — Op Note (Signed)
 [facility] Patient Name: Ann Rodgers Procedure Date: 04/17/2024 4:24 PM MRN: [id number] Endoscopist: Norleen SAILOR. Abran , MD, [id number] Age: 73 Referring MD:  Date of Birth: 20-May-1951 Gender: Female Account #: [id number] Procedure:                Upper GI endoscopy with biopsies Indications:              Epigastric abdominal pain, dark stools. Was on                            NSAIDs. Feeling better off NSAIDs Medicines:                Monitored Anesthesia Care Procedure:                Pre-Anesthesia Assessment:                           - Prior to the procedure, a History and Physical                            was performed, and patient medications and                            allergies were reviewed. The patient's tolerance of                            previous anesthesia was also reviewed. The risks                            and benefits of the procedure and the sedation                            options and risks were discussed with the patient.                            All questions were answered, and informed consent                            was obtained. Prior Anticoagulants: The patient has                            taken no anticoagulant or antiplatelet agents. ASA                            Grade Assessment: II - A patient with mild systemic                            disease. After reviewing the risks and benefits,                            the patient was deemed in satisfactory condition to                            undergo the procedure.  After obtaining informed consent, the endoscope was                            passed under direct vision. Throughout the                            procedure, the patient's blood pressure, pulse, and                            oxygen saturations were monitored continuously. The                            GIF [id number] #[id number] was introduced through the                            mouth, and  advanced to the second part of duodenum.                            The upper GI endoscopy was accomplished without                            difficulty. The patient tolerated the procedure                            well. Scope In: Scope Out: Findings:                 The esophagus revealed a large caliber ringlike                            stricture at the gastroesophageal junction (35 cm                            from the incisors. No active esophagitis or other                            abnormalities.                           The stomach a hiatal hernia as well as multiple                            benign fundic gland type polyps. No ulcers or other                            significant abnormalities of the gastric mucosa.                           The examined duodenum was normal except for a                            benign-appearing nodule in the second portion.                            Biopsies were taken with  a cold forceps for                            histology.                           The cardia and gastric fundus were normal on                            retroflexion. Complications:            No immediate complications. Estimated Blood Loss:     Estimated blood loss: none. Impression:               1. Incidental esophageal ring                           2. Hiatal hernia                           3. GERD                           4. Duodenal nodule. Biopsied. Recommendation:           - Patient has a contact number available for                            emergencies. The signs and symptoms of potential                            delayed complications were discussed with the                            patient. Return to normal activities tomorrow.                            Written discharge instructions were provided to the                            patient.                           - Resume previous diet.                           - Continue present  medications.                           - Await pathology results. Norleen SAILOR. Abran, MD 04/17/2024 4:44:02 PM This report has been signed electronically.

## 2024-04-18 ENCOUNTER — Telehealth: Payer: Self-pay

## 2024-04-18 NOTE — Telephone Encounter (Signed)
 Left message on follow up call.

## 2024-04-20 ENCOUNTER — Telehealth: Payer: Self-pay | Admitting: Internal Medicine

## 2024-04-20 LAB — SURGICAL PATHOLOGY

## 2024-04-20 NOTE — Telephone Encounter (Signed)
 Patient is calling and stated that she is still having abdominal pain. Patient is requesting to have a call back from the nurse. Please advise.

## 2024-04-20 NOTE — Telephone Encounter (Signed)
 Patient advised of recommendations as per Ellouise Console, PA-C. She verbalizes understanding.

## 2024-04-20 NOTE — Telephone Encounter (Signed)
 Patient calls stating that she is still experiencing generalized abdominal pain and nausea. She had endoscopy 04/17/24 showing incidental esophageal ring, HH, reflux and duodenal nodule. Biopsies pending review.  States she has ran out of nausea medication (metoclopramide  and zofran  prescribed by Dr Ovid All previously).  Patient states that her pain has not changed at all and remains as it did prior to her procedure. Denies any melena, hematemesis, chest pain etc.  She is advised to continue dicyclomine  20 mg as previously directed, pantoprazole  40mg , avoid all NSAIDs and await pathology results.  Dr Abran, please advise with any additional recommendations.

## 2024-04-24 ENCOUNTER — Ambulatory Visit: Payer: Self-pay | Admitting: Internal Medicine

## 2024-04-25 ENCOUNTER — Other Ambulatory Visit: Payer: Self-pay | Admitting: Physician Assistant

## 2024-04-25 NOTE — Telephone Encounter (Signed)
 Contacted patient by phone to remind her to have labwork completed next week (folate). She verbalizes understanding.

## 2024-05-10 ENCOUNTER — Other Ambulatory Visit

## 2024-05-10 DIAGNOSIS — E538 Deficiency of other specified B group vitamins: Secondary | ICD-10-CM | POA: Diagnosis not present

## 2024-05-10 LAB — FOLATE: Folate: 12.4 ng/mL (ref >5.9)

## 2024-05-15 ENCOUNTER — Ambulatory Visit: Payer: Self-pay | Admitting: Physician Assistant

## 2024-06-01 NOTE — Telephone Encounter (Signed)
 Ellouise, I am forwarding this message to you as you have recently seen this patient and I have not.

## 2024-06-03 ENCOUNTER — Emergency Department (HOSPITAL_BASED_OUTPATIENT_CLINIC_OR_DEPARTMENT_OTHER)
Admission: EM | Admit: 2024-06-03 | Discharge: 2024-06-03 | Disposition: A | Discharge status: Home / Self Care | Attending: Emergency Medicine | Admitting: Emergency Medicine

## 2024-06-03 ENCOUNTER — Other Ambulatory Visit: Payer: Self-pay

## 2024-06-03 ENCOUNTER — Encounter (HOSPITAL_BASED_OUTPATIENT_CLINIC_OR_DEPARTMENT_OTHER): Payer: Self-pay | Admitting: Emergency Medicine

## 2024-06-03 ENCOUNTER — Emergency Department (HOSPITAL_BASED_OUTPATIENT_CLINIC_OR_DEPARTMENT_OTHER)

## 2024-06-03 DIAGNOSIS — Z7982 Long term (current) use of aspirin: Secondary | ICD-10-CM | POA: Insufficient documentation

## 2024-06-03 DIAGNOSIS — R109 Unspecified abdominal pain: Secondary | ICD-10-CM | POA: Insufficient documentation

## 2024-06-03 LAB — URINALYSIS, W/ REFLEX TO CULTURE (INFECTION SUSPECTED)
Glucose, UA: NEGATIVE mg/dL
Hgb urine dipstick: NEGATIVE
Leukocytes,Ua: NEGATIVE
Nitrite: NEGATIVE
Protein, ur: 30 mg/dL — AB
Specific Gravity, Urine: 1.036 — ABNORMAL HIGH (ref 1.005–1.030)
pH: 7 (ref 5.0–8.0)

## 2024-06-03 LAB — CBC
HCT: 39.6 % (ref 36.0–46.0)
Hemoglobin: 12.6 g/dL (ref 12.0–15.0)
MCH: 29.6 pg (ref 26.0–34.0)
MCHC: 31.8 g/dL (ref 30.0–36.0)
MCV: 93.2 fL (ref 80.0–100.0)
Platelets: 360 K/uL (ref 150–400)
RBC: 4.25 MIL/uL (ref 3.87–5.11)
RDW: 12.6 % (ref 11.5–15.5)
WBC: 6 K/uL (ref 4.0–10.5)
nRBC: 0 % (ref 0.0–0.2)

## 2024-06-03 LAB — BASIC METABOLIC PANEL WITH GFR
Anion gap: 15 (ref 5–15)
BUN: 8 mg/dL (ref 8–23)
CO2: 25 mmol/L (ref 22–32)
Calcium: 10.2 mg/dL (ref 8.9–10.3)
Chloride: 99 mmol/L (ref 98–111)
Creatinine, Ser: 0.85 mg/dL (ref 0.44–1.00)
GFR, Estimated: >60 mL/min (ref >60)
Glucose, Bld: 120 mg/dL — ABNORMAL HIGH (ref 70–99)
Potassium: 3.7 mmol/L (ref 3.5–5.1)
Sodium: 139 mmol/L (ref 135–145)

## 2024-06-03 MED ORDER — OXYCODONE HCL 5 MG PO TABS: 5.0000 mg | ORAL_TABLET | Freq: Four times a day (QID) | ORAL | 0 refills | Status: AC | PRN

## 2024-06-03 MED ORDER — OXYCODONE-ACETAMINOPHEN 5-325 MG PO TABS
1.0000 | ORAL_TABLET | Freq: Once | ORAL | Status: AC
  Administered 2024-06-03: 1 via ORAL
  Filled 2024-06-03: qty 1

## 2024-06-03 MED ORDER — KETOROLAC TROMETHAMINE 30 MG/ML IJ SOLN
30.0000 mg | Freq: Once | INTRAMUSCULAR | Status: AC
  Administered 2024-06-03: 30 mg via INTRAMUSCULAR
  Filled 2024-06-03: qty 1

## 2024-06-03 NOTE — ED Provider Notes (Signed)
 [place] EMERGENCY DEPARTMENT AT [facility] Provider Note   CSN: [id number] Arrival date & time: 06/03/24  1426     Patient presents with: Flank Pain   Ann Rodgers is a 73 y.o. female presenting to ED with complaint of right-sided flank pain, that radiates around towards her groin.  She reports urinary pressure and some bleeding in her urine as well.  Reports a history of kidney stones.  Reports some nausea as well this week.  Says has been taking Tylenol  without relief of her pain.  Reports a history of a total hysterectomy.   HPI     Prior to Admission medications   Medication Sig Start Date End Date Taking? Authorizing Provider  oxyCODONE  (ROXICODONE ) 5 MG immediate release tablet Take 1 tablet (5 mg total) by mouth every 6 (six) hours as needed for up to 12 doses for severe pain (pain score 7-10). 06/03/24  Yes Ann Rodgers, Ann PARAS, MD  acetaminophen  (TYLENOL ) 500 MG tablet Take 1,000 mg by mouth every 6 (six) hours as needed for moderate pain (pain score 4-6).    [provider]  amLODipine (NORVASC) 10 MG tablet Take 10 mg by mouth at bedtime. 01/03/18   [provider]  aspirin EC 81 MG tablet Take 81 mg by mouth daily.    [provider]  cholecalciferol (VITAMIN D3) 25 MCG (1000 UNIT) tablet Take 1,000 Units by mouth daily.    [provider]  dicyclomine  (BENTYL ) 20 MG tablet Take 1 tablet (20 mg total) by mouth 2 (two) times daily. 01/16/24   Ann Rodgers, [place], MD  escitalopram (LEXAPRO) 5 MG tablet Take 5 mg by mouth daily. 03/01/22   [provider]  gabapentin  (NEURONTIN ) 300 MG capsule Take 600 mg by mouth at bedtime.    [provider]  ibuprofen  (ADVIL ) 800 MG tablet Take 800 mg by mouth every 8 (eight) hours as needed for moderate pain (pain score 4-6).    [provider]  losartan-hydrochlorothiazide (HYZAAR) 100-12.5 MG tablet Take 1 tablet by mouth daily. 12/20/17   [provider]   metoCLOPramide  (REGLAN ) 10 MG tablet Take 1 tablet (10 mg total) by mouth 4 (four) times daily -  before meals and at bedtime for 6 days. Patient not taking: Reported on 01/16/2024 07/26/23 08/01/23  Ann Cape, MD  ondansetron  (ZOFRAN ) 4 MG tablet Take 1 tablet (4 mg total) by mouth every 6 (six) hours as needed for nausea. 07/26/23   Ann Cape, MD  pantoprazole  (PROTONIX ) 40 MG tablet Take 40 mg by mouth daily. 10/18/19   [provider]  Polyethyl Glycol-Propyl Glycol (SYSTANE OP) Place 1 drop into both eyes daily as needed (dry eyes).    [provider]  potassium chloride (KLOR-CON) 10 MEQ tablet Take 10 mEq by mouth daily. 04/07/24   [provider]  rosuvastatin (CRESTOR) 10 MG tablet Take 10 mg by mouth daily. 03/01/22   [provider]  sucralfate  (CARAFATE ) 1 g tablet TAKE 1 TABLET (1 G TOTAL) BY MOUTH IN THE MORNING, AT NOON, AND AT BEDTIME. 04/26/24   Ann [place], PA-C  SYNTHROID 75 MCG tablet Take 75 mcg by mouth daily before breakfast.  07/28/15   [provider]  vitamin B-12 (CYANOCOBALAMIN ) 100 MCG tablet Take 100 mcg by mouth daily.    [provider]    Allergies: Codeine, Penicillins, Sulfa antibiotics, and Sulfamethoxazole-trimethoprim    Review of Systems  Updated Vital Signs BP 128/67   Pulse 66  Temp 98.2 F (36.8 C)   Resp 16   Ht 5' 2 (1.575 m)   Wt 90.7 kg   SpO2 97%   BMI 36.58 kg/m   Physical Exam Constitutional:      General: She is not in acute distress. HENT:     Head: Normocephalic and atraumatic.  Eyes:     Conjunctiva/sclera: Conjunctivae normal.     Pupils: Pupils are equal, round, and reactive to light.  Cardiovascular:     Rate and Rhythm: Normal rate and regular rhythm.  Pulmonary:     Effort: Pulmonary effort is normal. No respiratory distress.  Abdominal:     General: There is no distension.     Tenderness: There is no abdominal tenderness. There is right CVA tenderness.   Skin:    General: Skin is warm and dry.  Neurological:     General: No focal deficit present.     Mental Status: She is alert. Mental status is at baseline.  Psychiatric:        Mood and Affect: Mood normal.        Behavior: Behavior normal.     (all labs ordered are listed, but only abnormal results are displayed) Labs Reviewed  BASIC METABOLIC PANEL WITH GFR - Abnormal; Notable for the following components:      Result Value   Glucose, Bld 120 (*)    All other components within normal limits  URINALYSIS, W/ REFLEX TO CULTURE (INFECTION SUSPECTED) - Abnormal; Notable for the following components:   Specific Gravity, Urine 1.036 (*)    Bilirubin Urine SMALL (*)    Ketones, ur TRACE (*)    Protein, ur 30 (*)    Bacteria, UA RARE (*)    All other components within normal limits  CBC    EKG: None  Radiology: CT Renal Stone Study Result Date: 06/03/2024 CLINICAL DATA:  Abdominal and flank pain EXAM: CT ABDOMEN AND PELVIS WITHOUT CONTRAST TECHNIQUE: Multidetector CT imaging of the abdomen and pelvis was performed following the standard protocol without IV contrast. RADIATION DOSE REDUCTION: This exam was performed according to the departmental dose-optimization program which includes automated exposure control, adjustment of the mA and/or kV according to patient size and/or use of iterative reconstruction technique. COMPARISON:  01/16/2024 FINDINGS: Lower chest: No acute pleural or parenchymal lung disease. Small hiatal hernia. Hepatobiliary: Unremarkable unenhanced appearance of the liver and gallbladder. No biliary duct dilation. Pancreas: Unremarkable unenhanced appearance. Spleen: Unremarkable unenhanced appearance. Adrenals/Urinary Tract: No urinary tract calculi or obstructive uropathy within either kidney. The adrenals are unremarkable. The bladder is decompressed, limiting its evaluation. Stomach/Bowel: No bowel obstruction or ileus. Normal appendix right lower quadrant.  Scattered colonic diverticulosis without diverticulitis. No bowel wall thickening or inflammatory change. Small hiatal hernia. Vascular/Lymphatic: Aortic atherosclerosis. No enlarged abdominal or pelvic lymph nodes. Reproductive: Status post hysterectomy. No adnexal masses. Other: No free fluid or free intraperitoneal gas. Small fat containing umbilical hernia. No bowel herniation. Musculoskeletal: No acute or destructive bony abnormalities. Reconstructed images demonstrate no additional findings. IMPRESSION: 1. No urinary tract calculi or obstructive uropathy. 2. Normal appendix. 3. Scattered colonic diverticulosis without diverticulitis. 4. Small hiatal hernia. 5.  Aortic Atherosclerosis (ICD10-I70.0). Electronically Signed   By: Ozell Daring M.D.   On: 06/03/2024 17:38     Procedures   Medications Ordered in the ED  oxyCODONE -acetaminophen  (PERCOCET/ROXICET) 5-325 MG per tablet 1 tablet (1 tablet Oral Given 06/03/24 1708)  ketorolac (TORADOL) 30 MG/ML injection 30 mg (30 mg Intramuscular Given 06/03/24  10)                                    Medical Decision Making Amount and/or Complexity of Data Reviewed Labs: ordered. Radiology: ordered.  Risk Prescription drug management.   This patient presents to the ED with concern for flank pain. This involves an extensive number of treatment options, and is a complaint that carries with it a high risk of complications and morbidity.  The differential diagnosis includes pyelonephritis versus ureteral colic versus musculoskeletal pain versus other   I ordered and personally interpreted labs.  The pertinent results include: No emergent findings.  There is blood noted on the UA, no evidence of infection  I ordered imaging studies including CT renal stone study I independently visualized and interpreted imaging which showed renal cyst, no emergent finding I agree with the radiologist interpretation   I ordered medication including IM and oral  pain medicine  I have reviewed the patients home medicines and have made adjustments as needed  Test Considered: Low suspicion for acute biliary disease, appendicitis, AAA, or other life-threatening intra-abdominal cause of patient's pain.  No indication for ultrasound imaging at this time or CT imaging  After the interventions noted above, I reevaluated the patient and found that they have: improved   Dispostion:  After consideration of the diagnostic results and the patients response to treatment, I feel that the patent would benefit from close PCP follow-up this week..      Final diagnoses:  Right flank pain    ED Discharge Orders          Ordered    oxyCODONE  (ROXICODONE ) 5 MG immediate release tablet  Every 6 hours PRN        06/03/24 1905               Cottie Ann PARAS, MD 06/03/24 2013

## 2024-06-03 NOTE — ED Triage Notes (Signed)
 Pt c/o 1 week of right-sided flank pain with hx kidney stones. No difficulty urinating. She states she has noticed some blood when she wipes, unsure if from urethra or vagina. Pt has had a total hysterectomy.

## 2024-06-04 NOTE — Telephone Encounter (Signed)
 Lmtcb

## 2024-06-04 NOTE — Telephone Encounter (Signed)
Patient returning call, please advise.  Thank you.

## 2024-06-04 NOTE — Telephone Encounter (Signed)
 Left message for pt to call back. Per ER note it mentions blood in urine, no mention of blood in stool.

## 2024-06-27 ENCOUNTER — Ambulatory Visit: Admitting: Physician Assistant

## 2024-08-20 ENCOUNTER — Other Ambulatory Visit: Payer: Self-pay | Admitting: Internal Medicine

## 2024-08-20 DIAGNOSIS — N281 Cyst of kidney, acquired: Secondary | ICD-10-CM

## 2024-08-24 ENCOUNTER — Other Ambulatory Visit

## 2024-08-24 DIAGNOSIS — N281 Cyst of kidney, acquired: Secondary | ICD-10-CM
